# Patient Record
Sex: Male | Born: 1970 | Race: White | Hispanic: No | Marital: Married | Smoking: Never smoker
Health system: Southern US, Community
[De-identification: ages and names within clinical notes are randomized; demographics above are authoritative.]

## PROBLEM LIST (undated history)

## (undated) DIAGNOSIS — K219 Gastro-esophageal reflux disease without esophagitis: Secondary | ICD-10-CM

## (undated) DIAGNOSIS — K222 Esophageal obstruction: Secondary | ICD-10-CM

## (undated) DIAGNOSIS — I10 Essential (primary) hypertension: Secondary | ICD-10-CM

## (undated) DIAGNOSIS — S1121XA Laceration without foreign body of pharynx and cervical esophagus, initial encounter: Secondary | ICD-10-CM

## (undated) HISTORY — DX: Esophageal obstruction: K22.2

## (undated) HISTORY — DX: Gastro-esophageal reflux disease without esophagitis: K21.9

## (undated) HISTORY — DX: Laceration without foreign body of pharynx and cervical esophagus, initial encounter: S11.21XA

## (undated) HISTORY — DX: Essential (primary) hypertension: I10

---

## 2004-09-26 ENCOUNTER — Ambulatory Visit: Payer: Self-pay | Admitting: Family Medicine

## 2006-02-05 ENCOUNTER — Ambulatory Visit (HOSPITAL_COMMUNITY): Admission: RE | Admit: 2006-02-05 | Discharge: 2006-02-05 | Payer: Self-pay | Admitting: Orthopedic Surgery

## 2007-01-25 ENCOUNTER — Other Ambulatory Visit: Payer: Self-pay

## 2007-01-25 ENCOUNTER — Inpatient Hospital Stay: Payer: Self-pay | Admitting: Internal Medicine

## 2007-02-25 ENCOUNTER — Ambulatory Visit: Payer: Self-pay | Admitting: Gastroenterology

## 2007-11-03 IMAGING — CR DG CHEST 2V
1 series · 2 of 2 positions shown · non-contrast
Comparison: none

REASON FOR EXAM: chest pain
COMMENTS:

PROCEDURE:     DXR - DXR CHEST PA (OR AP) AND LATERAL  - January 25, 2007  [DATE]
RESULT:     No acute cardiopulmonary disease identified.

[Series 1: view not recorded · 0.17mm/px · 2 of 2 slices shown]
[im 1/2]
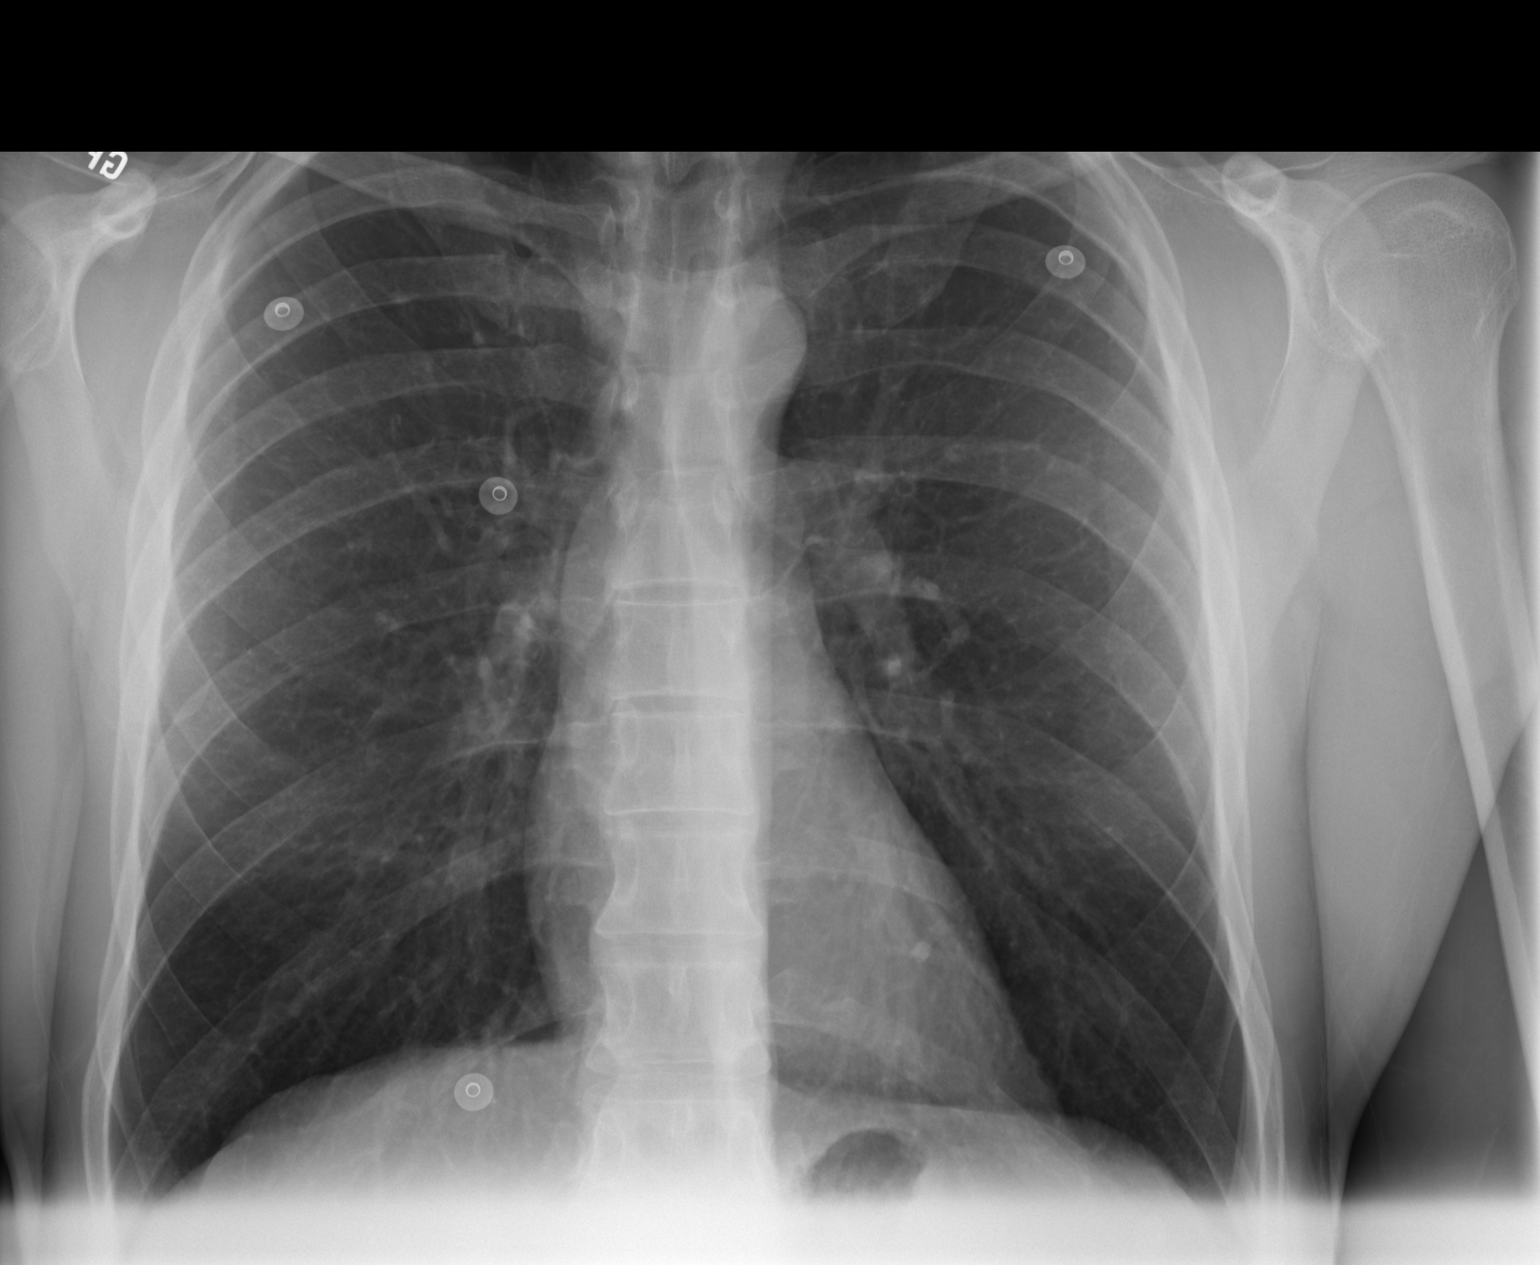
[im 2/2]
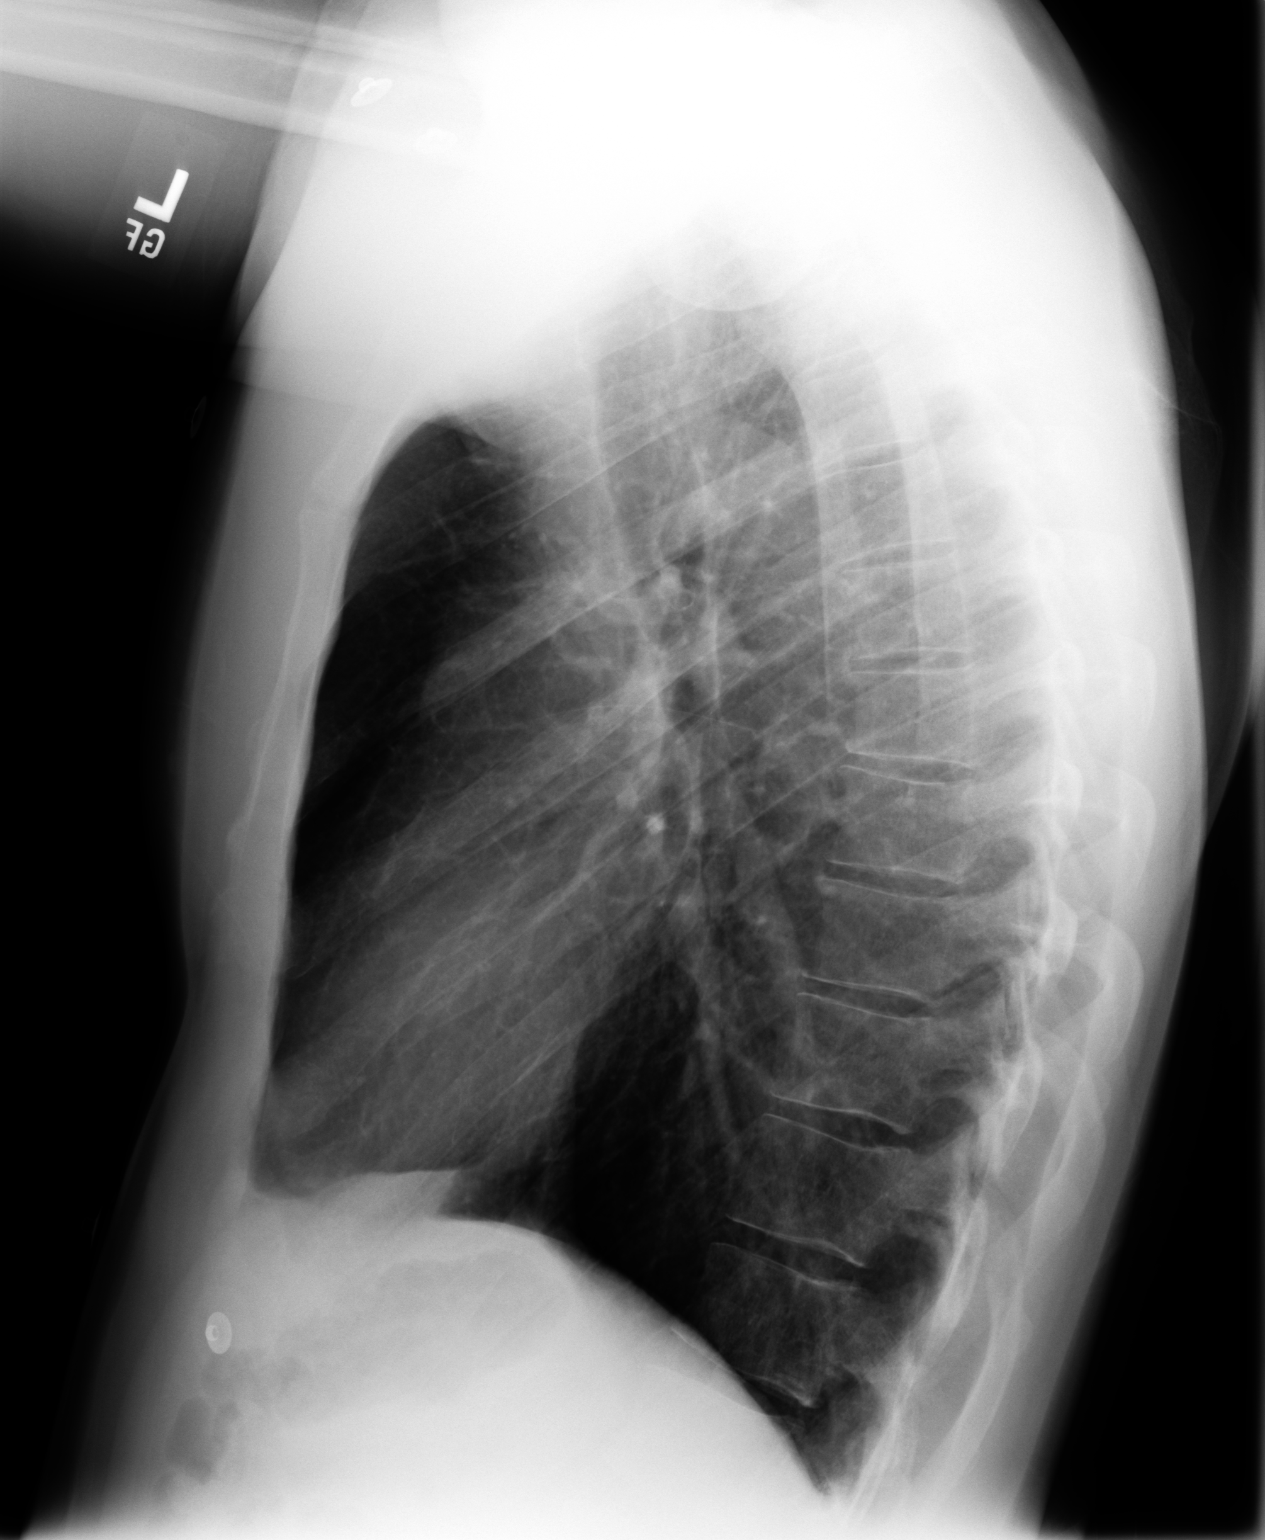

[2 of 2 positions shown; findings below may reference images not displayed]

IMPRESSION: 1)No acute cardiopulmonary disease.

## 2015-03-22 ENCOUNTER — Other Ambulatory Visit: Payer: Self-pay | Admitting: Family Medicine

## 2015-04-07 ENCOUNTER — Ambulatory Visit (INDEPENDENT_AMBULATORY_CARE_PROVIDER_SITE_OTHER): Payer: BLUE CROSS/BLUE SHIELD | Admitting: Family Medicine

## 2015-04-07 ENCOUNTER — Encounter: Payer: Self-pay | Admitting: Family Medicine

## 2015-04-07 VITALS — BP 137/80 | HR 56 | Temp 98.3°F | Ht 73.0 in | Wt 191.5 lb

## 2015-04-07 DIAGNOSIS — I10 Essential (primary) hypertension: Secondary | ICD-10-CM

## 2015-04-07 DIAGNOSIS — Z125 Encounter for screening for malignant neoplasm of prostate: Secondary | ICD-10-CM | POA: Diagnosis not present

## 2015-04-07 DIAGNOSIS — Z13220 Encounter for screening for lipoid disorders: Secondary | ICD-10-CM | POA: Diagnosis not present

## 2015-04-07 LAB — LIPID PANEL PICCOLO, WAIVED
CHOLESTEROL PICCOLO, WAIVED: 211 mg/dL — AB (ref <200)
Chol/HDL Ratio Piccolo,Waive: 3.5 mg/dL
HDL CHOL PICCOLO, WAIVED: 60 mg/dL (ref >59)
LDL Chol Calc Piccolo Waived: 120 mg/dL — ABNORMAL HIGH (ref <100)
Triglycerides Piccolo,Waived: 151 mg/dL — ABNORMAL HIGH (ref <150)
VLDL Chol Calc Piccolo,Waive: 30 mg/dL — ABNORMAL HIGH (ref <30)

## 2015-04-07 LAB — UA/M W/RFLX CULTURE, ROUTINE
BILIRUBIN UA: POSITIVE — AB
Glucose, UA: NEGATIVE
Ketones, UA: NEGATIVE
LEUKOCYTES UA: NEGATIVE
NITRITE UA: NEGATIVE
PROTEIN UA: NEGATIVE
RBC, UA: NEGATIVE
SPEC GRAV UA: 1.02 (ref 1.005–1.030)
UUROB: 0.2 mg/dL (ref 0.2–1.0)
pH, UA: 7 (ref 5.0–7.5)

## 2015-04-07 LAB — MICROALBUMIN, URINE WAIVED
Creatinine, Urine Waived: 200 mg/dL (ref 10–300)
Microalb, Ur Waived: 30 mg/L — ABNORMAL HIGH (ref 0–19)
Microalb/Creat Ratio: <30 mg/g (ref <30)

## 2015-04-07 LAB — CBC WITH DIFFERENTIAL/PLATELET
HEMATOCRIT: 42.6 % (ref 37.5–51.0)
HEMOGLOBIN: 15.3 g/dL (ref 12.6–17.7)
Lymphocytes Absolute: 2.3 10*3/uL (ref 0.7–3.1)
Lymphs: 37 %
MCH: 30.8 pg (ref 26.6–33.0)
MCHC: 35.9 g/dL — AB (ref 31.5–35.7)
MCV: 86 fL (ref 79–97)
MID (Absolute): 0.6 10*3/uL (ref 0.1–1.6)
MID: 10 %
NEUTROS ABS: 3.3 10*3/uL (ref 1.4–7.0)
Neutrophils: 53 %
Platelets: 245 10*3/uL (ref 150–379)
RBC: 4.96 x10E6/uL (ref 4.14–5.80)
RDW: 13.5 % (ref 12.3–15.4)
WBC: 6.2 10*3/uL (ref 3.4–10.8)

## 2015-04-07 MED ORDER — BENAZEPRIL HCL 40 MG PO TABS: 40.0000 mg | ORAL_TABLET | Freq: Every day | ORAL | Status: DC

## 2015-04-07 MED ORDER — MELOXICAM 15 MG PO TABS: 15.0000 mg | ORAL_TABLET | Freq: Every day | ORAL | Status: DC

## 2015-04-07 NOTE — Progress Notes (Signed)
BP 137/80 mmHg  Pulse 56  Temp(Src) 98.3 F (36.8 C)  Ht 6\' 1"  (1.854 m)  Wt 191 lb 8 oz (86.864 kg)  BMI 25.27 kg/m2  SpO2 98%   Subjective:    Patient ID: Brian Gillespie, male    DOB: 02-Dec-1970, 44 y.o.   MRN: [id number]  HPI: Brian HolmesDarin S Gemma is a 44 y.o. male  Chief Complaint  Patient presents with  . Hypertension   HYPERTENSION Hypertension status: controlled  Satisfied with current treatment? yes Duration of hypertension: chronic BP monitoring frequency:  rarely BP medication side effects:  no Medication compliance: excellent compliance Aspirin: no Recurrent headaches: no Visual changes: no Palpitations: no Dyspnea: no Chest pain: no Lower extremity edema: no Dizzy/lightheaded: no   Back pain on Saturday- went to the walk-in and got muscle relaxer and NSAID- doing much better now.   Relevant past medical, surgical, family and social history reviewed and updated as indicated. Interim medical history since our last visit reviewed. Allergies and medications reviewed and updated.  Review of Systems  Constitutional: Negative.   Respiratory: Negative.   Cardiovascular: Negative.   Gastrointestinal: Negative.   Musculoskeletal: Positive for myalgias and back pain. Negative for joint swelling, arthralgias, gait problem, neck pain and neck stiffness.  Neurological: Negative.   Psychiatric/Behavioral: Negative.    Per HPI unless specifically indicated above     Objective:    BP 137/80 mmHg  Pulse 56  Temp(Src) 98.3 F (36.8 C)  Ht 6\' 1"  (1.854 m)  Wt 191 lb 8 oz (86.864 kg)  BMI 25.27 kg/m2  SpO2 98%  Wt Readings from Last 3 Encounters:  04/07/15 191 lb 8 oz (86.864 kg)  09/07/14 187 lb (84.823 kg)    Physical Exam  Constitutional: He is oriented to person, place, and time. He appears well-developed and well-nourished. No distress.  HENT:  Head: Normocephalic and atraumatic.  Right Ear: Hearing normal.  Left Ear: Hearing normal.  Nose: Nose normal.   Eyes: Conjunctivae and lids are normal. Right eye exhibits no discharge. Left eye exhibits no discharge. No scleral icterus.  Cardiovascular: Normal rate, regular rhythm, normal heart sounds and intact distal pulses.  Exam reveals no gallop and no friction rub.   No murmur heard. Pulmonary/Chest: Effort normal and breath sounds normal. No respiratory distress. He has no wheezes. He has no rales. He exhibits no tenderness.  Musculoskeletal: Normal range of motion.  Neurological: He is alert and oriented to person, place, and time.  Skin: Skin is warm, dry and intact. No rash noted. No erythema. No pallor.  Psychiatric: He has a normal mood and affect. His speech is normal and behavior is normal. Judgment and thought content normal. Cognition and memory are normal.  Nursing note and vitals reviewed.   No results found for this or any previous visit.    Assessment & Plan:   Problem List Items Addressed This Visit      Cardiovascular and Mediastinum   HTN (hypertension) - Primary   Relevant Medications   benazepril (LOTENSIN) 40 MG tablet   Other Relevant Orders   CBC With Differential/Platelet   Comprehensive metabolic panel   Microalbumin, Urine Waived   TSH   UA/M w/rflx Culture, Routine    Other Visit Diagnoses    Screening for cholesterol level        Still borderline. Continue to work on diet and exercise. Recheck 6 months.     Relevant Orders    Lipid Panel [place]  Screening for prostate cancer        Relevant Orders    PSA    UA/M w/rflx Culture, Routine        Follow up plan: Return in about 6 months (around 10/08/2015) for PE.

## 2015-04-07 NOTE — Assessment & Plan Note (Signed)
Well controlled on current regimen. Continue current regimen. Continue to monitor.  

## 2015-04-07 NOTE — Patient Instructions (Signed)
Cholesterol  Cholesterol is a fat. Your body needs a small amount of cholesterol. Cholesterol may build up in your blood vessels. This increases your chance of having a heart attack or stroke.  You cannot feel your cholesterol levels. The only way to know your cholesterol level is high is with a blood test. Keep your test results. Work with your doctor to keep your cholesterol at a good level.  WHAT DO THE TEST RESULTS MEAN?  · Total cholesterol is how much cholesterol is in your blood.  · LDL is bad cholesterol. This is the type that can build up. You want LDL to be low.  · HDL is good cholesterol. It cleans your blood vessels and carries LDL away. You want HDL to be high.  · Triglycerides are fat that the body can burn for energy or store.  WHAT ARE GOOD LEVELS OF CHOLESTEROL?  · Total cholesterol below 200.  · LDL below 100 for people at risk. Below 70 for those at very high risk.  · HDL above 50 is good. Above 60 is best.  · Triglycerides below 150.  HOW CAN I LOWER MY CHOLESTEROL?  · Diet. Follow your diet programs as told by your doctor.  ¨ Choose fish, white meat chicken, roasted turkey, or baked turkey. Try not to eat red meat, fried foods, or processed meats such as sausage and lunch meats.  ¨ Eat lots of fresh fruits and vegetables.  ¨ Choose whole grains, beans, pasta, potatoes, and cereals.  ¨ Use only small amounts of olive, corn, or canola oils.  ¨ Try not to eat butter, mayonnaise, shortening, or palm kernel oils.  ¨ Try not to eat foods with trans fats.  ¨ Drink skim or nonfat milk. Eat low-fat or nonfat yogurt and cheeses. Try not to drink whole milk or cream. Try not to eat ice cream, egg yolks, and full-fat cheeses.  ¨ Healthy desserts include angel food cake, ginger snaps, animal crackers, hard candy, popsicles, and low-fat or nonfat frozen yogurt. Try not to eat pastries, cakes, pies, and cookies.  · Exercise. Follow your exercise programs as told by your doctor.  ¨ Be more active. You can try  gardening, walking, or taking the stairs. Ask your doctor about how you can be more active.  · Medicine. Take medicine as told by your doctor.  Document Released: 12/07/2008 Document Revised: 01/25/2014 Document Reviewed: 06/24/2013  [employer]® Patient Information ©2015 [employer]. This information is not intended to replace advice given to you by your health care provider. Make sure you discuss any questions you have with your health care provider.

## 2015-04-08 ENCOUNTER — Encounter: Payer: Self-pay | Admitting: Family Medicine

## 2015-04-08 LAB — COMPREHENSIVE METABOLIC PANEL
A/G RATIO: 1.9 (ref 1.1–2.5)
ALBUMIN: 4.4 g/dL (ref 3.5–5.5)
ALT: 39 IU/L (ref 0–44)
AST: 26 IU/L (ref 0–40)
Alkaline Phosphatase: 48 IU/L (ref 39–117)
BILIRUBIN TOTAL: 0.6 mg/dL (ref 0.0–1.2)
BUN / CREAT RATIO: 13 (ref 9–20)
BUN: 14 mg/dL (ref 6–24)
CALCIUM: 9.3 mg/dL (ref 8.7–10.2)
CO2: 23 mmol/L (ref 18–29)
CREATININE: 1.05 mg/dL (ref 0.76–1.27)
Chloride: 103 mmol/L (ref 97–108)
GFR, EST AFRICAN AMERICAN: 99 mL/min/{1.73_m2} (ref >59)
GFR, EST NON AFRICAN AMERICAN: 85 mL/min/{1.73_m2} (ref >59)
GLOBULIN, TOTAL: 2.3 g/dL (ref 1.5–4.5)
Glucose: 89 mg/dL (ref 65–99)
Potassium: 4.3 mmol/L (ref 3.5–5.2)
SODIUM: 142 mmol/L (ref 134–144)
TOTAL PROTEIN: 6.7 g/dL (ref 6.0–8.5)

## 2015-04-08 LAB — TSH: TSH: 0.749 u[IU]/mL (ref 0.450–4.500)

## 2015-04-08 LAB — PSA: PROSTATE SPECIFIC AG, SERUM: 1.3 ng/mL (ref 0.0–4.0)

## 2015-11-01 ENCOUNTER — Other Ambulatory Visit: Payer: Self-pay | Admitting: Family Medicine

## 2015-11-01 NOTE — Telephone Encounter (Signed)
Patient is scheduled for 11/08/15 @ 9:00am, can you please call enough medication to get him through until his appointment

## 2015-11-01 NOTE — Telephone Encounter (Signed)
Needs an appointment.

## 2015-11-08 ENCOUNTER — Encounter: Payer: Self-pay | Admitting: Family Medicine

## 2015-11-08 ENCOUNTER — Ambulatory Visit (INDEPENDENT_AMBULATORY_CARE_PROVIDER_SITE_OTHER): Payer: BLUE CROSS/BLUE SHIELD | Admitting: Family Medicine

## 2015-11-08 VITALS — BP 133/81 | HR 49 | Temp 97.5°F | Ht 72.1 in | Wt 190.0 lb

## 2015-11-08 DIAGNOSIS — I10 Essential (primary) hypertension: Secondary | ICD-10-CM

## 2015-11-08 DIAGNOSIS — M545 Low back pain, unspecified: Secondary | ICD-10-CM

## 2015-11-08 MED ORDER — MELOXICAM 15 MG PO TABS: ORAL_TABLET | ORAL | Status: DC

## 2015-11-08 MED ORDER — BENAZEPRIL HCL 40 MG PO TABS: 40.0000 mg | ORAL_TABLET | Freq: Every day | ORAL | Status: DC

## 2015-11-08 NOTE — Progress Notes (Signed)
BP 133/81 mmHg  Pulse 49  Temp(Src) 97.5 F (36.4 C)  Ht 6' 0.1" (1.831 m)  Wt 190 lb (86.183 kg)  BMI 25.71 kg/m2  SpO2 100%   Subjective:    Patient ID: Brian Gillespie, male    DOB: 11/08/1970, 45 y.o.   MRN: [id number]  HPI: Brian Gillespie is a 45 y.o. male  Chief Complaint  Patient presents with  . Hypertension    Refills needed  . Back Pain    Refills needed   HYPERTENSION Hypertension status: controlled  Satisfied with current treatment? yes Duration of hypertension: chronic BP monitoring frequency:  not checking BP medication side effects:  no Medication compliance: excellent compliance Aspirin: no Recurrent headaches: no Visual changes: no Palpitations: no Dyspnea: no Chest pain: no Lower extremity edema: no Dizzy/lightheaded: no  BACK PAIN Duration: chronic Mechanism of injury: lifting Location: bilateral and low back Onset: gradual Severity: moderate Quality: dull Frequency: intermittent Radiation: none Aggravating factors: movement, bending and prolonged sitting Alleviating factors: NSAIDs Status: worse Treatments attempted: aleve and HEP  Relief with NSAIDs?: significant Nighttime pain:  no Paresthesias / decreased sensation:  no Bowel / bladder incontinence:  no Fevers:  no Dysuria / urinary frequency:  no   Relevant past medical, surgical, family and social history reviewed and updated as indicated. Interim medical history since our last visit reviewed. Allergies and medications reviewed and updated.  Review of Systems  Constitutional: Negative.   Respiratory: Negative.   Cardiovascular: Negative.   Musculoskeletal: Positive for myalgias and back pain. Negative for joint swelling, arthralgias, gait problem, neck pain and neck stiffness.  Psychiatric/Behavioral: Negative.     Per HPI unless specifically indicated above     Objective:    BP 133/81 mmHg  Pulse 49  Temp(Src) 97.5 F (36.4 C)  Ht 6' 0.1" (1.831 m)  Wt 190 lb  (86.183 kg)  BMI 25.71 kg/m2  SpO2 100%  Wt Readings from Last 3 Encounters:  11/08/15 190 lb (86.183 kg)  04/07/15 191 lb 8 oz (86.864 kg)  09/07/14 187 lb (84.823 kg)    Physical Exam  Constitutional: He is oriented to person, place, and time. He appears well-developed and well-nourished. No distress.  HENT:  Head: Normocephalic and atraumatic.  Right Ear: Hearing normal.  Left Ear: Hearing normal.  Nose: Nose normal.  Eyes: Conjunctivae and lids are normal. Right eye exhibits no discharge. Left eye exhibits no discharge. No scleral icterus.  Cardiovascular: Normal rate, regular rhythm, normal heart sounds and intact distal pulses.  Exam reveals no gallop and no friction rub.   No murmur heard. Pulmonary/Chest: Effort normal and breath sounds normal. No respiratory distress. He has no wheezes. He has no rales. He exhibits no tenderness.  Musculoskeletal: Normal range of motion.  Neurological: He is alert and oriented to person, place, and time.  Skin: Skin is warm, dry and intact. No rash noted. No erythema. No pallor.  Psychiatric: He has a normal mood and affect. His speech is normal and behavior is normal. Judgment and thought content normal. Cognition and memory are normal.  Nursing note and vitals reviewed. Back Exam:    Inspection:  Normal spinal curvature.  No deformity, ecchymosis, erythema, or lesions     Palpation:     Midline spinal tenderness: no       Paralumbar tenderness: yes bilateral     Parathoracic tenderness: no      Buttocks tenderness: no     Range of Motion:  Flexion: Normal     Extension:Decreased     Lateral bending:Normal    Rotation:Normal    Neuro Exam:Lower extremity DTRs normal & symmetric.  Strength and sensation intact.    Special Tests:      Straight leg raise:negative   Results for orders placed or performed in visit on 04/07/15  Comprehensive metabolic panel  Result Value Ref Range   Glucose 89 65 - 99 mg/dL   BUN 14 6 - 24 mg/dL    Creatinine, Ser 1.61 0.76 - 1.27 mg/dL   GFR calc non Af Amer 85 >59 mL/min/1.73   GFR calc Af Amer 99 >59 mL/min/1.73   BUN/Creatinine Ratio 13 9 - 20   Sodium 142 134 - 144 mmol/L   Potassium 4.3 3.5 - 5.2 mmol/L   Chloride 103 97 - 108 mmol/L   CO2 23 18 - 29 mmol/L   Calcium 9.3 8.7 - 10.2 mg/dL   Total Protein 6.7 6.0 - 8.5 g/dL   Albumin 4.4 3.5 - 5.5 g/dL   Globulin, Total 2.3 1.5 - 4.5 g/dL   Albumin/Globulin Ratio 1.9 1.1 - 2.5   Bilirubin Total 0.6 0.0 - 1.2 mg/dL   Alkaline Phosphatase 48 39 - 117 IU/L   AST 26 0 - 40 IU/L   ALT 39 0 - 44 IU/L  Lipid Panel Piccolo, Waived  Result Value Ref Range   Cholesterol Piccolo, Waived 211 (H) <200 mg/dL   HDL Chol Piccolo, Waived 60 >59 mg/dL   Triglycerides Piccolo,Waived 151 (H) <150 mg/dL   Chol/HDL Ratio Piccolo,Waive 3.5 mg/dL   LDL Chol Calc Piccolo Waived 120 (H) <100 mg/dL   VLDL Chol Calc Piccolo,Waive 30 (H) <30 mg/dL  Microalbumin, Urine Waived  Result Value Ref Range   Microalb, Ur Waived 30 (H) 0 - 19 mg/L   Creatinine, Urine Waived 200 10 - 300 mg/dL   Microalb/Creat Ratio <30 <30 mg/g  TSH  Result Value Ref Range   TSH 0.749 0.450 - 4.500 uIU/mL  PSA  Result Value Ref Range   Prostate Specific Ag, Serum 1.3 0.0 - 4.0 ng/mL  UA/M w/rflx Culture, Routine  Result Value Ref Range   Specific Gravity, UA 1.020 1.005 - 1.030   pH, UA 7.0 5.0 - 7.5   Color, UA Yellow Yellow   Appearance Ur Clear Clear   Leukocytes, UA Negative Negative   Protein, UA Negative Negative/Trace   Glucose, UA Negative Negative   Ketones, UA Negative Negative   RBC, UA Negative Negative   Bilirubin, UA Positive (A) Negative   Urobilinogen, Ur 0.2 0.2 - 1.0 mg/dL   Nitrite, UA Negative Negative  CBC With Differential/Platelet  Result Value Ref Range   WBC 6.2 3.4 - 10.8 x10E3/uL   RBC 4.96 4.14 - 5.80 x10E6/uL   Hemoglobin 15.3 12.6 - 17.7 g/dL   Hematocrit 09.6 04.5 - 51.0 %   MCV 86 79 - 97 fL   MCH 30.8 26.6 - 33.0 pg    MCHC 35.9 (H) 31.5 - 35.7 g/dL   RDW 40.9 81.1 - 91.4 %   Platelets 245 150 - 379 x10E3/uL   Neutrophils 53 %   Lymphs 37 %   MID 10 %   Neutrophils Absolute 3.3 1.4 - 7.0 x10E3/uL   Lymphocytes Absolute 2.3 0.7 - 3.1 x10E3/uL   MID (Absolute) 0.6 0.1 - 1.6 X10E3/uL      Assessment & Plan:   Problem List Items Addressed This Visit      Cardiovascular and  Mediastinum   HTN (hypertension) - Primary    Under good control. Continue current regimen. Refills given. Return for recheck in 6 months at physical.       Relevant Medications   benazepril (LOTENSIN) 40 MG tablet   Other Relevant Orders   Basic metabolic panel    Other Visit Diagnoses    Bilateral low back pain without sciatica        Under good control on current regimen. Continue exercises. Refills given.     Relevant Medications    meloxicam (MOBIC) 15 MG tablet        Follow up plan: Return in about 6 months (around 05/07/2016) for For physical.

## 2015-11-08 NOTE — Assessment & Plan Note (Signed)
Under good control. Continue current regimen. Refills given. Return for recheck in 6 months at physical.

## 2015-11-09 ENCOUNTER — Encounter: Payer: Self-pay | Admitting: Family Medicine

## 2015-11-09 LAB — BASIC METABOLIC PANEL
BUN/Creatinine Ratio: 19 (ref 9–20)
BUN: 19 mg/dL (ref 6–24)
CALCIUM: 9.6 mg/dL (ref 8.7–10.2)
CO2: 26 mmol/L (ref 18–29)
Chloride: 102 mmol/L (ref 96–106)
Creatinine, Ser: 1.01 mg/dL (ref 0.76–1.27)
GFR calc Af Amer: 103 mL/min/{1.73_m2} (ref >59)
GFR calc non Af Amer: 89 mL/min/{1.73_m2} (ref >59)
GLUCOSE: 88 mg/dL (ref 65–99)
POTASSIUM: 4.2 mmol/L (ref 3.5–5.2)
Sodium: 140 mmol/L (ref 134–144)

## 2016-03-17 DIAGNOSIS — M545 Low back pain: Secondary | ICD-10-CM | POA: Diagnosis not present

## 2016-03-17 DIAGNOSIS — Z791 Long term (current) use of non-steroidal anti-inflammatories (NSAID): Secondary | ICD-10-CM | POA: Diagnosis not present

## 2016-03-29 DIAGNOSIS — M545 Low back pain: Secondary | ICD-10-CM | POA: Diagnosis not present

## 2016-04-02 ENCOUNTER — Telehealth: Payer: Self-pay

## 2016-04-02 NOTE — Telephone Encounter (Signed)
Patient requested lab results, printed for patient to pick up.

## 2016-04-03 DIAGNOSIS — M545 Low back pain: Secondary | ICD-10-CM | POA: Diagnosis not present

## 2016-06-19 ENCOUNTER — Other Ambulatory Visit: Payer: Self-pay | Admitting: Family Medicine

## 2016-06-26 ENCOUNTER — Other Ambulatory Visit: Payer: Self-pay | Admitting: Family Medicine

## 2016-06-28 DIAGNOSIS — Z23 Encounter for immunization: Secondary | ICD-10-CM | POA: Diagnosis not present

## 2016-09-29 ENCOUNTER — Other Ambulatory Visit: Payer: Self-pay | Admitting: Family Medicine

## 2016-10-18 ENCOUNTER — Ambulatory Visit (INDEPENDENT_AMBULATORY_CARE_PROVIDER_SITE_OTHER): Payer: BLUE CROSS/BLUE SHIELD | Admitting: Family Medicine

## 2016-10-18 ENCOUNTER — Encounter: Payer: Self-pay | Admitting: Family Medicine

## 2016-10-18 VITALS — BP 159/81 | HR 61 | Temp 98.6°F | Wt 191.0 lb

## 2016-10-18 DIAGNOSIS — R6889 Other general symptoms and signs: Secondary | ICD-10-CM | POA: Diagnosis not present

## 2016-10-18 DIAGNOSIS — J101 Influenza due to other identified influenza virus with other respiratory manifestations: Secondary | ICD-10-CM | POA: Diagnosis not present

## 2016-10-18 LAB — VERITOR FLU A/B WAIVED
Influenza A: POSITIVE — AB
Influenza B: NEGATIVE

## 2016-10-18 MED ORDER — OSELTAMIVIR PHOSPHATE 75 MG PO CAPS: 75.0000 mg | ORAL_CAPSULE | Freq: Two times a day (BID) | ORAL | 0 refills | Status: DC

## 2016-10-18 NOTE — Progress Notes (Signed)
BP (!) 159/81   Pulse 61   Temp 98.6 F (37 C)   Wt 191 lb (86.6 kg)   SpO2 100%   BMI 25.83 kg/m    Subjective:    Patient ID: Brian Gillespie, male    DOB: 25-Jul-1971, 46 y.o.   MRN: [id number]  HPI: [name] is a 46 y.o. male  Chief Complaint  Patient presents with  . Sinusitis    x 2 days. Possible fever. body aches, head/chest pressure, sinus drainage, raw throat. No cough. No ear ache.   Patient presents with 2 day history of fever, body aches, chills, congestion, sinus pressure, and sore throat. Denies cough, ear pain, CP, SOB, wheezing. Taking ibuprofen prn for sxs with some relief. No sick contacts reported.   Past Medical History:  Diagnosis Date  . Esophageal stenosis   . Esophageal tear   . Hypertension    Social History   Social History  . Marital status: Married    Spouse name: N/A  . Number of children: N/A  . Years of education: N/A   Occupational History  . Not on file.   Social History Main Topics  . Smoking status: Never Smoker  . Smokeless tobacco: Former Neurosurgeon    Quit date: 09/24/2004  . Alcohol use No  . Drug use: No  . Sexual activity: Not Currently   Other Topics Concern  . Not on file   Social History Narrative  . No narrative on file    Relevant past medical, surgical, family and social history reviewed and updated as indicated. Interim medical history since our last visit reviewed. Allergies and medications reviewed and updated.  Review of Systems  Constitutional: Positive for chills, diaphoresis and fever.  HENT: Positive for congestion, sinus pressure and sore throat.   Respiratory: Negative.   Cardiovascular: Negative.   Gastrointestinal: Negative.   Genitourinary: Negative.   Musculoskeletal: Positive for myalgias.  Neurological: Negative.   Psychiatric/Behavioral: Negative.     Per HPI unless specifically indicated above     Objective:    BP (!) 159/81   Pulse 61   Temp 98.6 F (37 C)   Wt 191 lb (86.6  kg)   SpO2 100%   BMI 25.83 kg/m   Wt Readings from Last 3 Encounters:  10/18/16 191 lb (86.6 kg)  11/08/15 190 lb (86.2 kg)  04/07/15 191 lb 8 oz (86.9 kg)    Physical Exam  Constitutional: He is oriented to person, place, and time. He appears well-developed and well-nourished.  HENT:  Head: Atraumatic.  Right Ear: External ear normal.  Left Ear: External ear normal.  Nose: Nose normal.  Mouth/Throat: No oropharyngeal exudate.  Oropharynx erythematous Nasal mucosa injected  Eyes: Conjunctivae are normal. Pupils are equal, round, and reactive to light. No scleral icterus.  Neck: Normal range of motion. Neck supple.  Cardiovascular: Normal rate.   Pulmonary/Chest: Effort normal and breath sounds normal. No respiratory distress.  Musculoskeletal: Normal range of motion.  Neurological: He is alert and oriented to person, place, and time.  Skin: Skin is warm and dry.  Psychiatric: He has a normal mood and affect. His behavior is normal.  Nursing note and vitals reviewed.     Assessment & Plan:   Problem List Items Addressed This Visit    None    Visit Diagnoses    Influenza A    -  Primary   Tamiflu sent, supportive care discussed. Follow up if worsening or no improvement.  Relevant Medications   oseltamivir (TAMIFLU) 75 MG capsule   Other Relevant Orders   Influenza A & B (STAT) (Completed)       Follow up plan: Return if symptoms worsen or fail to improve.

## 2016-10-18 NOTE — Patient Instructions (Signed)
Follow up as needed

## 2016-10-23 ENCOUNTER — Other Ambulatory Visit: Payer: Self-pay | Admitting: Family Medicine

## 2016-11-04 ENCOUNTER — Other Ambulatory Visit: Payer: Self-pay | Admitting: Family Medicine

## 2016-11-05 NOTE — Telephone Encounter (Signed)
Last (acute) OV: 10/18/16 Last routine OV: 11/08/15 Next OV: none on file.

## 2016-11-24 ENCOUNTER — Other Ambulatory Visit: Payer: Self-pay | Admitting: Family Medicine

## 2016-12-29 ENCOUNTER — Other Ambulatory Visit: Payer: Self-pay | Admitting: Unknown Physician Specialty

## 2016-12-31 NOTE — Telephone Encounter (Signed)
Your patient.  Thanks 

## 2017-01-27 ENCOUNTER — Other Ambulatory Visit: Payer: Self-pay | Admitting: Family Medicine

## 2017-02-25 ENCOUNTER — Other Ambulatory Visit: Payer: Self-pay | Admitting: Family Medicine

## 2017-03-27 ENCOUNTER — Other Ambulatory Visit: Payer: Self-pay | Admitting: Unknown Physician Specialty

## 2017-05-07 ENCOUNTER — Other Ambulatory Visit: Payer: Self-pay | Admitting: Family Medicine

## 2017-05-21 ENCOUNTER — Ambulatory Visit (INDEPENDENT_AMBULATORY_CARE_PROVIDER_SITE_OTHER): Payer: BLUE CROSS/BLUE SHIELD | Admitting: Family Medicine

## 2017-05-21 ENCOUNTER — Encounter: Payer: Self-pay | Admitting: Family Medicine

## 2017-05-21 VITALS — BP 128/70 | HR 56 | Temp 98.5°F | Ht 72.3 in | Wt 184.3 lb

## 2017-05-21 DIAGNOSIS — K219 Gastro-esophageal reflux disease without esophagitis: Secondary | ICD-10-CM | POA: Diagnosis not present

## 2017-05-21 DIAGNOSIS — R1310 Dysphagia, unspecified: Secondary | ICD-10-CM

## 2017-05-21 DIAGNOSIS — Z0000 Encounter for general adult medical examination without abnormal findings: Secondary | ICD-10-CM

## 2017-05-21 DIAGNOSIS — R1319 Other dysphagia: Secondary | ICD-10-CM

## 2017-05-21 DIAGNOSIS — I10 Essential (primary) hypertension: Secondary | ICD-10-CM | POA: Diagnosis not present

## 2017-05-21 LAB — UA/M W/RFLX CULTURE, ROUTINE
BILIRUBIN UA: NEGATIVE
Glucose, UA: NEGATIVE
KETONES UA: NEGATIVE
LEUKOCYTES UA: NEGATIVE
NITRITE UA: NEGATIVE
Protein, UA: NEGATIVE
RBC UA: NEGATIVE
SPEC GRAV UA: 1.015 (ref 1.005–1.030)
UUROB: 0.2 mg/dL (ref 0.2–1.0)
pH, UA: 6 (ref 5.0–7.5)

## 2017-05-21 LAB — MICROALBUMIN, URINE WAIVED
Creatinine, Urine Waived: 200 mg/dL (ref 10–300)
MICROALB, UR WAIVED: 30 mg/L — AB (ref 0–19)
Microalb/Creat Ratio: <30 mg/g (ref <30)

## 2017-05-21 LAB — MICROSCOPIC EXAMINATION: WBC UA: NONE SEEN /HPF (ref 0–?)

## 2017-05-21 MED ORDER — OMEPRAZOLE 40 MG PO CPDR: 40.0000 mg | DELAYED_RELEASE_CAPSULE | Freq: Every day | ORAL | 3 refills | Status: DC

## 2017-05-21 NOTE — Assessment & Plan Note (Addendum)
Will start on 40mg  of omeprazole and recheck in 1 month. Will get into see GI- referral generated today.

## 2017-05-21 NOTE — Assessment & Plan Note (Signed)
Under good control on recheck. Continue current regimen. Continue to monitor. Call with any concerns.  

## 2017-05-21 NOTE — Progress Notes (Signed)
BP 128/70   Pulse (!) 56   Temp 98.5 F (36.9 C)   Ht 6' 0.3" (1.836 m)   Wt 184 lb 5 oz (83.6 kg)   SpO2 100%   BMI 24.79 kg/m    Subjective:    Patient ID: Brian Gillespie, male    DOB: 1971-07-02, 46 y.o.   MRN: [id number]  HPI: Brian Gillespie is a 46 y.o. male presenting on 05/21/2017 for comprehensive medical examination. Current medical complaints include:  HYPERTENSION Hypertension status: stable  Satisfied with current treatment? yes Duration of hypertension: chronic BP monitoring frequency:  not checking BP medication side effects:  no Medication compliance: good compliance Previous BP meds: benazepril Aspirin: no Recurrent headaches: no Visual changes: no Palpitations: no Dyspnea: no Chest pain: no Lower extremity edema: no Dizzy/lightheaded: no  GERD- dentist said that the acid is starting to wear away at his enamal GERD control status: uncontrolled  Satisfied with current treatment? no Heartburn frequency: several times a week Previous GERD medications: Nexium for about a month Antacid use frequency:  Several times a week Duration: years Nature: indigestion Location: in his stomach with burping Dysphagia: yes- tore his esophagus years ago, had dilation Odynophagia:  no Hematemesis: no Blood in stool: no EGD: yes- between 5-10 years ago  He currently lives with: wife Interim Problems from his last visit: yes  Depression Screen done today and results listed below:  Depression screen [facility] 2/9 05/21/2017  Decreased Interest 0  Down, Depressed, Hopeless 0  PHQ - 2 Score 0  Altered sleeping 1  Tired, decreased energy 0  Change in appetite 0  Feeling bad or failure about yourself  0  Trouble concentrating 0  Moving slowly or fidgety/restless 0  Suicidal thoughts 0  PHQ-9 Score 1  Difficult doing work/chores Not difficult at all   Past Medical History:  Past Medical History:  Diagnosis Date  . Esophageal stenosis   . Esophageal tear   .  Hypertension     Surgical History:  History reviewed. No pertinent surgical history.  Medications:  Current Outpatient Prescriptions on File Prior to Visit  Medication Sig  . benazepril (LOTENSIN) 40 MG tablet TAKE 1 TABLET(40 MG) BY MOUTH DAILY  . meloxicam (MOBIC) 15 MG tablet TAKE 1 TABLET(15 MG) BY MOUTH DAILY   No current facility-administered medications on file prior to visit.     Allergies:  Allergies  Allergen Reactions  . Penicillins Rash    Social History:  Social History   Social History  . Marital status: Married    Spouse name: N/A  . Number of children: N/A  . Years of education: N/A   Occupational History  . Not on file.   Social History Main Topics  . Smoking status: Never Smoker  . Smokeless tobacco: Former Neurosurgeon    Quit date: 09/24/2004  . Alcohol use No  . Drug use: No  . Sexual activity: Yes   Other Topics Concern  . Not on file   Social History Narrative  . No narrative on file   History  Smoking Status  . Never Smoker  Smokeless Tobacco  . Former Neurosurgeon  . Quit date: 09/24/2004   History  Alcohol Use No    Family History:  Family History  Problem Relation Age of Onset  . Cancer Paternal Grandmother   . Cancer Paternal Grandfather     Past medical history, surgical history, medications, allergies, family history and social history reviewed with patient today and changes  made to appropriate areas of the chart.   Review of Systems  Constitutional: Negative.   HENT: Negative.   Eyes: Positive for blurred vision. Negative for double vision, photophobia, pain, discharge and redness.  Respiratory: Negative.   Cardiovascular: Negative.   Gastrointestinal: Positive for heartburn. Negative for abdominal pain, blood in stool, constipation, diarrhea, melena, nausea and vomiting.  Genitourinary: Negative.   Musculoskeletal: Negative.   Skin: Negative.   Neurological: Negative.   Endo/Heme/Allergies: Negative.   Psychiatric/Behavioral:  Negative.     All other ROS negative except what is listed above and in the HPI.      Objective:    BP 128/70   Pulse (!) 56   Temp 98.5 F (36.9 C)   Ht 6' 0.3" (1.836 m)   Wt 184 lb 5 oz (83.6 kg)   SpO2 100%   BMI 24.79 kg/m   Wt Readings from Last 3 Encounters:  05/21/17 184 lb 5 oz (83.6 kg)  10/18/16 191 lb (86.6 kg)  11/08/15 190 lb (86.2 kg)    Physical Exam  Constitutional: He is oriented to person, place, and time. He appears well-developed and well-nourished. No distress.  HENT:  Head: Normocephalic and atraumatic.  Right Ear: Hearing, tympanic membrane, external ear and ear canal normal.  Left Ear: Hearing, tympanic membrane, external ear and ear canal normal.  Nose: Nose normal.  Mouth/Throat: Uvula is midline, oropharynx is clear and moist and mucous membranes are normal. No oropharyngeal exudate.  Eyes: Pupils are equal, round, and reactive to light. Conjunctivae, EOM and lids are normal. Right eye exhibits no discharge. Left eye exhibits no discharge. No scleral icterus.  Neck: Normal range of motion. Neck supple. No JVD present. No tracheal deviation present. No thyromegaly present.  Cardiovascular: Normal rate, regular rhythm, normal heart sounds and intact distal pulses.  Exam reveals no gallop and no friction rub.   No murmur heard. Pulmonary/Chest: Effort normal and breath sounds normal. No stridor. No respiratory distress. He has no wheezes. He has no rales. He exhibits no tenderness.  Abdominal: Soft. Bowel sounds are normal. He exhibits no distension and no mass. There is no tenderness. There is no rebound and no guarding. Hernia confirmed negative in the right inguinal area and confirmed negative in the left inguinal area.  Genitourinary: Testes normal and penis normal. Right testis shows no mass, no swelling and no tenderness. Right testis is descended. Cremasteric reflex is not absent on the right side. Left testis shows no mass, no swelling and no  tenderness. Left testis is descended. Cremasteric reflex is not absent on the left side. Circumcised. No phimosis, paraphimosis, hypospadias, penile erythema or penile tenderness. No discharge found.  Musculoskeletal: Normal range of motion. He exhibits no edema, tenderness or deformity.  Lymphadenopathy:    He has no cervical adenopathy.  Neurological: He is alert and oriented to person, place, and time. He has normal reflexes. He displays normal reflexes. No cranial nerve deficit. He exhibits normal muscle tone. Coordination normal.  Skin: Skin is warm, dry and intact. No rash noted. He is not diaphoretic. No erythema. No pallor.  Psychiatric: He has a normal mood and affect. His speech is normal and behavior is normal. Judgment and thought content normal. Cognition and memory are normal.  Nursing note and vitals reviewed.   Results for orders placed or performed in visit on 10/18/16  Influenza A & B (STAT)  Result Value Ref Range   Influenza A Positive (A) Negative   Influenza B Negative  Negative      Assessment & Plan:   Problem List Items Addressed This Visit      Cardiovascular and Mediastinum   HTN (hypertension)    Under good control on recheck. Continue current regimen. Continue to monitor. Call with any concerns.       Relevant Orders   Microalbumin, Urine Waived     Digestive   Dysphagia    Will start on 40mg  of omeprazole and recheck in 1 month. Will get into see GI- referral generated today.      Relevant Orders   Ambulatory referral to Gastroenterology    Other Visit Diagnoses    Routine general medical examination at a health care facility    -  Primary   Vaccines up to date. Screening labs checked today. Continue diet and exercise. Call with any concerns.    Relevant Orders   CBC with Differential/Platelet   Comprehensive metabolic panel   Lipid Panel w/o Chol/HDL Ratio   Microalbumin, Urine Waived   TSH   UA/M w/rflx Culture, Routine   Gastroesophageal  reflux disease, esophagitis presence not specified       Will start omeprazole 40mg  and recheck in 1 month.    Relevant Medications   omeprazole (PRILOSEC) 40 MG capsule   Other Relevant Orders   Ambulatory referral to Gastroenterology   H. pylori antibody, IgG       LABORATORY TESTING:  Health maintenance labs ordered today as discussed above.   IMMUNIZATIONS:   - Tdap: Tetanus vaccination status reviewed: last tetanus booster within 10 years. - Influenza: Postponed to flu season - Pneumovax: Not applicable  PATIENT COUNSELING:    Sexuality: Discussed sexually transmitted diseases, partner selection, use of condoms, avoidance of unintended pregnancy  and contraceptive alternatives.   Advised to avoid cigarette smoking.  I discussed with the patient that most people either abstain from alcohol or drink within safe limits (<=14/week and <=4 drinks/occasion for males, <=7/weeks and <= 3 drinks/occasion for females) and that the risk for alcohol disorders and other health effects rises proportionally with the number of drinks per week and how often a drinker exceeds daily limits.  Discussed cessation/primary prevention of drug use and availability of treatment for abuse.   Diet: Encouraged to adjust caloric intake to maintain  or achieve ideal body weight, to reduce intake of dietary saturated fat and total fat, to limit sodium intake by avoiding high sodium foods and not adding table salt, and to maintain adequate dietary potassium and calcium preferably from fresh fruits, vegetables, and low-fat dairy products.    stressed the importance of regular exercise  Injury prevention: Discussed safety belts, safety helmets, smoke detector, smoking near bedding or upholstery.   Dental health: Discussed importance of regular tooth brushing, flossing, and dental visits.   Follow up plan: NEXT PREVENTATIVE PHYSICAL DUE IN 1 YEAR. Return in about 4 weeks (around 06/18/2017) for Follow up  GERD.

## 2017-05-21 NOTE — Patient Instructions (Addendum)

## 2017-05-22 ENCOUNTER — Encounter: Payer: Self-pay | Admitting: Family Medicine

## 2017-05-22 LAB — CBC WITH DIFFERENTIAL/PLATELET
BASOS: 1 %
Basophils Absolute: 0 10*3/uL (ref 0.0–0.2)
EOS (ABSOLUTE): 0.2 10*3/uL (ref 0.0–0.4)
EOS: 2 %
HEMATOCRIT: 45.7 % (ref 37.5–51.0)
Hemoglobin: 15.9 g/dL (ref 13.0–17.7)
IMMATURE GRANULOCYTES: 0 %
Immature Grans (Abs): 0 10*3/uL (ref 0.0–0.1)
Lymphocytes Absolute: 2 10*3/uL (ref 0.7–3.1)
Lymphs: 30 %
MCH: 30.8 pg (ref 26.6–33.0)
MCHC: 34.8 g/dL (ref 31.5–35.7)
MCV: 88 fL (ref 79–97)
MONOS ABS: 0.5 10*3/uL (ref 0.1–0.9)
Monocytes: 7 %
NEUTROS ABS: 3.9 10*3/uL (ref 1.4–7.0)
Neutrophils: 60 %
PLATELETS: 278 10*3/uL (ref 150–379)
RBC: 5.17 x10E6/uL (ref 4.14–5.80)
RDW: 14.4 % (ref 12.3–15.4)
WBC: 6.6 10*3/uL (ref 3.4–10.8)

## 2017-05-22 LAB — COMPREHENSIVE METABOLIC PANEL
A/G RATIO: 1.9 (ref 1.2–2.2)
ALT: 22 IU/L (ref 0–44)
AST: 23 IU/L (ref 0–40)
Albumin: 4.7 g/dL (ref 3.5–5.5)
Alkaline Phosphatase: 54 IU/L (ref 39–117)
BILIRUBIN TOTAL: 0.7 mg/dL (ref 0.0–1.2)
BUN/Creatinine Ratio: 13 (ref 9–20)
BUN: 16 mg/dL (ref 6–24)
CALCIUM: 9.6 mg/dL (ref 8.7–10.2)
CHLORIDE: 102 mmol/L (ref 96–106)
CO2: 25 mmol/L (ref 20–29)
Creatinine, Ser: 1.22 mg/dL (ref 0.76–1.27)
GFR, EST AFRICAN AMERICAN: 81 mL/min/{1.73_m2} (ref >59)
GFR, EST NON AFRICAN AMERICAN: 70 mL/min/{1.73_m2} (ref >59)
GLOBULIN, TOTAL: 2.5 g/dL (ref 1.5–4.5)
Glucose: 89 mg/dL (ref 65–99)
POTASSIUM: 4.6 mmol/L (ref 3.5–5.2)
SODIUM: 142 mmol/L (ref 134–144)
TOTAL PROTEIN: 7.2 g/dL (ref 6.0–8.5)

## 2017-05-22 LAB — TSH: TSH: 1.09 u[IU]/mL (ref 0.450–4.500)

## 2017-05-22 LAB — LIPID PANEL W/O CHOL/HDL RATIO
Cholesterol, Total: 213 mg/dL — ABNORMAL HIGH (ref 100–199)
HDL: 51 mg/dL (ref >39)
LDL CALC: 136 mg/dL — AB (ref 0–99)
Triglycerides: 130 mg/dL (ref 0–149)
VLDL Cholesterol Cal: 26 mg/dL (ref 5–40)

## 2017-05-22 LAB — H. PYLORI ANTIBODY, IGG: H. pylori, IgG AbS: <0.8 Index Value (ref 0.00–0.79)

## 2017-05-23 ENCOUNTER — Encounter: Payer: Self-pay | Admitting: Gastroenterology

## 2017-06-04 ENCOUNTER — Other Ambulatory Visit: Payer: Self-pay | Admitting: Family Medicine

## 2017-06-16 ENCOUNTER — Other Ambulatory Visit: Payer: Self-pay | Admitting: Unknown Physician Specialty

## 2017-06-17 ENCOUNTER — Other Ambulatory Visit: Payer: Self-pay | Admitting: Family Medicine

## 2017-06-17 MED ORDER — BENAZEPRIL HCL 40 MG PO TABS: ORAL_TABLET | ORAL | 1 refills | Status: DC

## 2017-06-17 MED ORDER — OMEPRAZOLE 40 MG PO CPDR: 40.0000 mg | DELAYED_RELEASE_CAPSULE | Freq: Every day | ORAL | 1 refills | Status: DC

## 2017-06-17 MED ORDER — MELOXICAM 15 MG PO TABS: ORAL_TABLET | ORAL | 1 refills | Status: DC

## 2017-06-18 ENCOUNTER — Ambulatory Visit: Payer: BLUE CROSS/BLUE SHIELD | Admitting: Family Medicine

## 2017-06-27 ENCOUNTER — Ambulatory Visit (INDEPENDENT_AMBULATORY_CARE_PROVIDER_SITE_OTHER): Payer: BLUE CROSS/BLUE SHIELD | Admitting: Family Medicine

## 2017-06-27 ENCOUNTER — Encounter: Payer: Self-pay | Admitting: Family Medicine

## 2017-06-27 VITALS — BP 146/89 | HR 52 | Temp 98.9°F | Wt 190.1 lb

## 2017-06-27 DIAGNOSIS — M549 Dorsalgia, unspecified: Secondary | ICD-10-CM

## 2017-06-27 DIAGNOSIS — Z23 Encounter for immunization: Secondary | ICD-10-CM

## 2017-06-27 MED ORDER — CYCLOBENZAPRINE HCL 10 MG PO TABS: 10.0000 mg | ORAL_TABLET | Freq: Three times a day (TID) | ORAL | 0 refills | Status: DC | PRN

## 2017-06-27 MED ORDER — NAPROXEN 500 MG PO TABS: 500.0000 mg | ORAL_TABLET | Freq: Two times a day (BID) | ORAL | 0 refills | Status: DC

## 2017-06-27 MED ORDER — TRAMADOL HCL 50 MG PO TABS: 50.0000 mg | ORAL_TABLET | Freq: Three times a day (TID) | ORAL | 0 refills | Status: DC | PRN

## 2017-06-27 NOTE — Patient Instructions (Addendum)
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
## 2017-07-05 ENCOUNTER — Other Ambulatory Visit: Payer: Self-pay | Admitting: Family Medicine

## 2017-07-08 ENCOUNTER — Encounter: Payer: Self-pay | Admitting: Family Medicine

## 2017-07-08 MED ORDER — NAPROXEN 500 MG PO TABS: 500.0000 mg | ORAL_TABLET | Freq: Two times a day (BID) | ORAL | 0 refills | Status: DC

## 2017-07-08 MED ORDER — CYCLOBENZAPRINE HCL 10 MG PO TABS: 10.0000 mg | ORAL_TABLET | Freq: Three times a day (TID) | ORAL | 0 refills | Status: DC | PRN

## 2017-08-23 ENCOUNTER — Encounter: Payer: Self-pay | Admitting: Family Medicine

## 2017-08-23 ENCOUNTER — Ambulatory Visit: Payer: BLUE CROSS/BLUE SHIELD | Admitting: Family Medicine

## 2017-08-23 VITALS — BP 153/88 | HR 53 | Temp 97.6°F | Wt 191.2 lb

## 2017-08-23 DIAGNOSIS — J069 Acute upper respiratory infection, unspecified: Secondary | ICD-10-CM

## 2017-08-23 MED ORDER — HYDROCOD POLST-CPM POLST ER 10-8 MG/5ML PO SUER: 5.0000 mL | Freq: Two times a day (BID) | ORAL | 0 refills | Status: DC | PRN

## 2017-08-23 MED ORDER — BENZONATATE 200 MG PO CAPS: 200.0000 mg | ORAL_CAPSULE | Freq: Two times a day (BID) | ORAL | 0 refills | Status: DC | PRN

## 2017-08-23 MED ORDER — AZITHROMYCIN 250 MG PO TABS: ORAL_TABLET | ORAL | 0 refills | Status: DC

## 2017-08-23 NOTE — Progress Notes (Signed)
   BP (!) 153/88 (BP Location: Left Arm, Patient Position: Sitting, Cuff Size: Large)   Pulse (!) 53   Temp 97.6 F (36.4 C)   Wt 191 lb 3 oz (86.7 kg)   SpO2 100%   BMI 25.71 kg/m    Subjective:    Patient ID: Brian HolmesDarin S Gillespie, male    DOB: 08-29-1971, 46 y.o.   MRN: [id number]  HPI: Brian HolmesDarin S Gillespie is a 46 y.o. male  Chief Complaint  Patient presents with  . URI    X 2 weeks, cough, nasal congestion, chest pain    2 weeks of congestion, sore throat, productive cough, facial pain and pressure, chest tightness and soreness. Some body aches, not sure if any fevers, no SOB. Trying mucinex and OTC cold medicine with no relief. No sick contacts.   Relevant past medical, surgical, family and social history reviewed and updated as indicated. Interim medical history since our last visit reviewed. Allergies and medications reviewed and updated.  Review of Systems  Constitutional: Negative.   HENT: Positive for congestion, sinus pressure, sinus pain and sore throat.   Respiratory: Positive for cough and chest tightness.   Cardiovascular: Negative.   Gastrointestinal: Negative.   Genitourinary: Negative.   Musculoskeletal: Negative.   Neurological: Negative.   Psychiatric/Behavioral: Negative.     Per HPI unless specifically indicated above     Objective:    BP (!) 153/88 (BP Location: Left Arm, Patient Position: Sitting, Cuff Size: Large)   Pulse (!) 53   Temp 97.6 F (36.4 C)   Wt 191 lb 3 oz (86.7 kg)   SpO2 100%   BMI 25.71 kg/m   Wt Readings from Last 3 Encounters:  08/23/17 191 lb 3 oz (86.7 kg)  06/27/17 190 lb 2 oz (86.2 kg)  05/21/17 184 lb 5 oz (83.6 kg)    Physical Exam  Constitutional: He is oriented to person, place, and time. He appears well-developed and well-nourished. No distress.  HENT:  Head: Atraumatic.  Right Ear: External ear normal.  Left Ear: External ear normal.  Oropharynx diffusely erythematous Nasal mucosa injected with discharge present    Eyes: Conjunctivae are normal. Pupils are equal, round, and reactive to light. No scleral icterus.  Neck: Normal range of motion. Neck supple.  Cardiovascular: Normal rate, regular rhythm and normal heart sounds.  Pulmonary/Chest: Effort normal and breath sounds normal. No respiratory distress. He has no wheezes. He has no rales.  Musculoskeletal: Normal range of motion.  Neurological: He is alert and oriented to person, place, and time.  Skin: Skin is warm and dry.  Psychiatric: He has a normal mood and affect. His behavior is normal.  Nursing note and vitals reviewed.     Assessment & Plan:   Problem List Items Addressed This Visit    None    Visit Diagnoses    Upper respiratory tract infection, unspecified type    -  Primary   Will treat with zpack, tessalon, and tussionex. Supportive care reviewed. Precautions discussed with sedation and tussionex. F/u if no improvement   Relevant Medications   azithromycin (ZITHROMAX) 250 MG tablet       Follow up plan: Return if symptoms worsen or fail to improve.

## 2017-08-23 NOTE — Patient Instructions (Signed)
Follow up as needed

## 2017-08-28 ENCOUNTER — Encounter: Payer: Self-pay | Admitting: Family Medicine

## 2017-09-09 ENCOUNTER — Other Ambulatory Visit: Payer: Self-pay | Admitting: Family Medicine

## 2017-09-23 ENCOUNTER — Ambulatory Visit: Payer: BLUE CROSS/BLUE SHIELD | Admitting: Family Medicine

## 2017-09-23 ENCOUNTER — Encounter: Payer: Self-pay | Admitting: Family Medicine

## 2017-09-23 VITALS — BP 137/96 | HR 66 | Temp 98.4°F | Wt 197.0 lb

## 2017-09-23 DIAGNOSIS — R21 Rash and other nonspecific skin eruption: Secondary | ICD-10-CM | POA: Diagnosis not present

## 2017-09-23 MED ORDER — HYDROXYZINE HCL 25 MG PO TABS: 25.0000 mg | ORAL_TABLET | Freq: Three times a day (TID) | ORAL | 0 refills | Status: DC | PRN

## 2017-09-23 MED ORDER — PREDNISONE 10 MG PO TABS: 10.0000 mg | ORAL_TABLET | Freq: Every day | ORAL | 0 refills | Status: DC

## 2017-09-23 NOTE — Progress Notes (Signed)
BP (!) 137/96   Pulse 66   Temp 98.4 F (36.9 C) (Oral)   Wt 197 lb (89.4 kg)   SpO2 100%   BMI 26.50 kg/m    Subjective:    Patient ID: Brian Gillespie, male    DOB: [birth year], 46 y.o.   MRN: [id number]  HPI: Brian Gillespie is a 46 y.o. male  Chief Complaint  Patient presents with  . Cellulitis    Back is worse, some on legs. 3 weeks ago.    Patient presents today with diffuse rash that's been present almost a month now. No new exposures, doesn't itch too badly other than on arms. States he seems to get this about once a year in fall/winter. Has been under a lot of stress the past few weeks [place] Has not tried anything at home other than a few leftover prednisone tabs he had, which temporarily cleared the rash.   Relevant past medical, surgical, family and social history reviewed and updated as indicated. Interim medical history since our last visit reviewed. Allergies and medications reviewed and updated.  Review of Systems  Constitutional: Negative.   HENT: Negative.   Eyes: Negative.   Respiratory: Negative.   Cardiovascular: Negative.   Gastrointestinal: Negative.   Genitourinary: Negative.   Musculoskeletal: Negative.   Skin: Positive for rash.  Neurological: Negative.   Psychiatric/Behavioral: Negative.    Per HPI unless specifically indicated above     Objective:    BP (!) 137/96   Pulse 66   Temp 98.4 F (36.9 C) (Oral)   Wt 197 lb (89.4 kg)   SpO2 100%   BMI 26.50 kg/m   Wt Readings from Last 3 Encounters:  09/23/17 197 lb (89.4 kg)  08/23/17 191 lb 3 oz (86.7 kg)  06/27/17 190 lb 2 oz (86.2 kg)    Physical Exam  Constitutional: He is oriented to person, place, and time. He appears well-developed and well-nourished. No distress.  HENT:  Head: Atraumatic.  Eyes: Conjunctivae are normal. Pupils are equal, round, and reactive to light. No scleral icterus.  Neck: Normal range of motion. Neck supple.  Cardiovascular: Normal  rate and normal heart sounds.  Pulmonary/Chest: Effort normal and breath sounds normal. No respiratory distress.  Musculoskeletal: Normal range of motion.  Neurological: He is alert and oriented to person, place, and time.  Skin: Skin is warm and dry. Rash (Small red papules diffusely spread across body sparing the face and hands) noted.  Psychiatric: He has a normal mood and affect. His behavior is normal.  Nursing note and vitals reviewed.  Results for orders placed or performed in visit on 05/21/17  Microscopic Examination  Result Value Ref Range   WBC, UA None seen 0 - 5 /hpf   RBC, UA 0-2 0 - 2 /hpf   Epithelial Cells (non renal) 0-10 0 - 10 /hpf   Bacteria, UA Few None seen/Few  CBC with Differential/Platelet  Result Value Ref Range   WBC 6.6 3.4 - 10.8 x10E3/uL   RBC 5.17 4.14 - 5.80 x10E6/uL   Hemoglobin 15.9 13.0 - 17.7 g/dL   Hematocrit 19.145.7 47.837.5 - 51.0 %   MCV 88 79 - 97 fL   MCH 30.8 26.6 - 33.0 pg   MCHC 34.8 31.5 - 35.7 g/dL   RDW 29.514.4 62.112.3 - 30.815.4 %   Platelets 278 150 - 379 x10E3/uL   Neutrophils 60 Not Estab. %   Lymphs 30 Not Estab. %  Monocytes 7 Not Estab. %   Eos 2 Not Estab. %   Basos 1 Not Estab. %   Neutrophils Absolute 3.9 1.4 - 7.0 x10E3/uL   Lymphocytes Absolute 2.0 0.7 - 3.1 x10E3/uL   Monocytes Absolute 0.5 0.1 - 0.9 x10E3/uL   EOS (ABSOLUTE) 0.2 0.0 - 0.4 x10E3/uL   Basophils Absolute 0.0 0.0 - 0.2 x10E3/uL   Immature Granulocytes 0 Not Estab. %   Immature Grans (Abs) 0.0 0.0 - 0.1 x10E3/uL  Comprehensive metabolic panel  Result Value Ref Range   Glucose 89 65 - 99 mg/dL   BUN 16 6 - 24 mg/dL   Creatinine, Ser 4.091.22 0.76 - 1.27 mg/dL   GFR calc non Af Amer 70 >59 mL/min/1.73   GFR calc Af Amer 81 >59 mL/min/1.73   BUN/Creatinine Ratio 13 9 - 20   Sodium 142 134 - 144 mmol/L   Potassium 4.6 3.5 - 5.2 mmol/L   Chloride 102 96 - 106 mmol/L   CO2 25 20 - 29 mmol/L   Calcium 9.6 8.7 - 10.2 mg/dL   Total Protein 7.2 6.0 - 8.5 g/dL   Albumin  4.7 3.5 - 5.5 g/dL   Globulin, Total 2.5 1.5 - 4.5 g/dL   Albumin/Globulin Ratio 1.9 1.2 - 2.2   Bilirubin Total 0.7 0.0 - 1.2 mg/dL   Alkaline Phosphatase 54 39 - 117 IU/L   AST 23 0 - 40 IU/L   ALT 22 0 - 44 IU/L  Lipid Panel w/o Chol/HDL Ratio  Result Value Ref Range   Cholesterol, Total 213 (H) 100 - 199 mg/dL   Triglycerides 811130 0 - 149 mg/dL   HDL 51 >91>39 mg/dL   VLDL Cholesterol Cal 26 5 - 40 mg/dL   LDL Calculated 478136 (H) 0 - 99 mg/dL  Microalbumin, Urine Waived  Result Value Ref Range   Microalb, Ur Waived 30 (H) 0 - 19 mg/L   Creatinine, Urine Waived 200 10 - 300 mg/dL   Microalb/Creat Ratio <30 <30 mg/g  TSH  Result Value Ref Range   TSH 1.090 0.450 - 4.500 uIU/mL  UA/M w/rflx Culture, Routine  Result Value Ref Range   Specific Gravity, UA 1.015 1.005 - 1.030   pH, UA 6.0 5.0 - 7.5   Color, UA Yellow Yellow   Appearance Ur Cloudy (A) Clear   Leukocytes, UA Negative Negative   Protein, UA Negative Negative/Trace   Glucose, UA Negative Negative   Ketones, UA Negative Negative   RBC, UA Negative Negative   Bilirubin, UA Negative Negative   Urobilinogen, Ur 0.2 0.2 - 1.0 mg/dL   Nitrite, UA Negative Negative   Microscopic Examination See below:   H. pylori antibody, IgG  Result Value Ref Range   H. pylori, IgG AbS <0.80 0.00 - 0.79 Index Value      Assessment & Plan:   Problem List Items Addressed This Visit    None    Visit Diagnoses    Rash    -  Primary   Unknown trigger, gets fairly regularly about once a year. Will treat with extended prednisone taper, hydroxyzine prn, and good moisturizer regimen       Follow up plan: Return if symptoms worsen or fail to improve.

## 2017-09-23 NOTE — Patient Instructions (Signed)
Follow up as needed

## 2017-12-17 ENCOUNTER — Other Ambulatory Visit: Payer: Self-pay | Admitting: Family Medicine

## 2018-01-03 ENCOUNTER — Other Ambulatory Visit: Payer: Self-pay | Admitting: Family Medicine

## 2018-02-17 ENCOUNTER — Other Ambulatory Visit: Payer: Self-pay | Admitting: Family Medicine

## 2018-02-20 NOTE — Telephone Encounter (Signed)
Meloxicam refill Last Refill:12/18/17  # 90 No RF Last OV: 11/08/15 PCP: Olevia Perches DO Pharmacy:[facility] [address]

## 2018-02-24 NOTE — Telephone Encounter (Signed)
LVM to call back to schedule an appt

## 2018-03-06 ENCOUNTER — Ambulatory Visit: Payer: BLUE CROSS/BLUE SHIELD | Admitting: Family Medicine

## 2018-03-06 ENCOUNTER — Encounter: Payer: Self-pay | Admitting: Family Medicine

## 2018-03-06 VITALS — BP 135/78 | HR 52 | Temp 98.6°F | Wt 197.6 lb

## 2018-03-06 DIAGNOSIS — R5383 Other fatigue: Secondary | ICD-10-CM | POA: Diagnosis not present

## 2018-03-06 DIAGNOSIS — I10 Essential (primary) hypertension: Secondary | ICD-10-CM | POA: Diagnosis not present

## 2018-03-06 DIAGNOSIS — M549 Dorsalgia, unspecified: Secondary | ICD-10-CM | POA: Diagnosis not present

## 2018-03-06 DIAGNOSIS — Z13220 Encounter for screening for lipoid disorders: Secondary | ICD-10-CM | POA: Diagnosis not present

## 2018-03-06 DIAGNOSIS — K219 Gastro-esophageal reflux disease without esophagitis: Secondary | ICD-10-CM

## 2018-03-06 LAB — UA/M W/RFLX CULTURE, ROUTINE
BILIRUBIN UA: NEGATIVE
Glucose, UA: NEGATIVE
KETONES UA: NEGATIVE
LEUKOCYTES UA: NEGATIVE
Nitrite, UA: NEGATIVE
PROTEIN UA: NEGATIVE
SPEC GRAV UA: 1.015 (ref 1.005–1.030)
Urobilinogen, Ur: 0.2 mg/dL (ref 0.2–1.0)
pH, UA: 6.5 (ref 5.0–7.5)

## 2018-03-06 LAB — MICROSCOPIC EXAMINATION: Bacteria, UA: NONE SEEN

## 2018-03-06 MED ORDER — MELOXICAM 15 MG PO TABS: 15.0000 mg | ORAL_TABLET | Freq: Every day | ORAL | 1 refills | Status: DC

## 2018-03-06 MED ORDER — OMEPRAZOLE 40 MG PO CPDR: 40.0000 mg | DELAYED_RELEASE_CAPSULE | Freq: Every day | ORAL | 1 refills | Status: DC

## 2018-03-06 MED ORDER — BENAZEPRIL HCL 40 MG PO TABS: 40.0000 mg | ORAL_TABLET | Freq: Every day | ORAL | 1 refills | Status: DC

## 2018-03-06 NOTE — Assessment & Plan Note (Signed)
Under good control. Continue current regimen. Continue to monitor. Call with any concerns. 

## 2018-03-06 NOTE — Progress Notes (Signed)
BP 135/78 (BP Location: Left Arm, Cuff Size: Normal)   Pulse (!) 52   Temp 98.6 F (37 C)   Wt 197 lb 9 oz (89.6 kg)   SpO2 100%   BMI 26.57 kg/m    Subjective:    Patient ID: Brian Gillespie, male    DOB: [birth year], 47 y.o.   MRN: [id number]  HPI: Brian Gillespie is a 47 y.o. male  Chief Complaint  Patient presents with  . Hypertension  . Gastroesophageal Reflux  . Back Pain    refill on Meloxicam   HYPERTENSION Hypertension status: controlled  Satisfied with current treatment? yes Duration of hypertension: chronic BP monitoring frequency:  not checking BP medication side effects:  no Medication compliance: excellent Previous BP meds: benzaepril Aspirin: no Recurrent headaches: no Visual changes: no Palpitations: no Dyspnea: no Chest pain: no Lower extremity edema: no Dizzy/lightheaded: no  GERD GERD control status: controlled  Satisfied with current treatment? yes Heartburn frequency: never Medication side effects: no  Medication compliance: excellent Dysphagia: no Odynophagia:  no Hematemesis: no Blood in stool: no EGD: no  BACK PAIN- doing well on the meloxicam. No concerns. Feeling well.   Relevant past medical, surgical, family and social history reviewed and updated as indicated. Interim medical history since our last visit reviewed. Allergies and medications reviewed and updated.  Review of Systems  Constitutional: Positive for fatigue. Negative for activity change, appetite change, chills, diaphoresis, fever and unexpected weight change.  Respiratory: Negative.   Cardiovascular: Negative.   Psychiatric/Behavioral: Negative.     Per HPI unless specifically indicated above     Objective:    BP 135/78 (BP Location: Left Arm, Cuff Size: Normal)   Pulse (!) 52   Temp 98.6 F (37 C)   Wt 197 lb 9 oz (89.6 kg)   SpO2 100%   BMI 26.57 kg/m   Wt Readings from Last 3 Encounters:  03/06/18 197 lb 9 oz (89.6 kg)  09/23/17 197 lb (89.4 kg)    08/23/17 191 lb 3 oz (86.7 kg)    Physical Exam  Constitutional: He is oriented to person, place, and time. He appears well-developed and well-nourished. No distress.  HENT:  Head: Normocephalic and atraumatic.  Right Ear: Hearing normal.  Left Ear: Hearing normal.  Nose: Nose normal.  Eyes: Conjunctivae and lids are normal. Right eye exhibits no discharge. Left eye exhibits no discharge. No scleral icterus.  Cardiovascular: Normal rate, regular rhythm, normal heart sounds and intact distal pulses. Exam reveals no gallop and no friction rub.  No murmur heard. Pulmonary/Chest: Effort normal and breath sounds normal. No stridor. No respiratory distress. He has no wheezes. He has no rales. He exhibits no tenderness.  Musculoskeletal: Normal range of motion.  Neurological: He is alert and oriented to person, place, and time.  Skin: Skin is warm, dry and intact. Capillary refill takes less than 2 seconds. No rash noted. He is not diaphoretic. No erythema. No pallor.  Psychiatric: He has a normal mood and affect. His speech is normal and behavior is normal. Judgment and thought content normal. Cognition and memory are normal.  Nursing note and vitals reviewed.   Results for orders placed or performed in visit on 05/21/17  Microscopic Examination  Result Value Ref Range   WBC, UA None seen 0 - 5 /hpf   RBC, UA 0-2 0 - 2 /hpf   Epithelial Cells (non renal) 0-10 0 - 10 /hpf   Bacteria, UA Few None seen/Few  CBC with Differential/Platelet  Result Value Ref Range   WBC 6.6 3.4 - 10.8 x10E3/uL   RBC 5.17 4.14 - 5.80 x10E6/uL   Hemoglobin 15.9 13.0 - 17.7 g/dL   Hematocrit 16.1 09.6 - 51.0 %   MCV 88 79 - 97 fL   MCH 30.8 26.6 - 33.0 pg   MCHC 34.8 31.5 - 35.7 g/dL   RDW 04.5 40.9 - 81.1 %   Platelets 278 150 - 379 x10E3/uL   Neutrophils 60 Not Estab. %   Lymphs 30 Not Estab. %   Monocytes 7 Not Estab. %   Eos 2 Not Estab. %   Basos 1 Not Estab. %   Neutrophils Absolute 3.9 1.4 - 7.0  x10E3/uL   Lymphocytes Absolute 2.0 0.7 - 3.1 x10E3/uL   Monocytes Absolute 0.5 0.1 - 0.9 x10E3/uL   EOS (ABSOLUTE) 0.2 0.0 - 0.4 x10E3/uL   Basophils Absolute 0.0 0.0 - 0.2 x10E3/uL   Immature Granulocytes 0 Not Estab. %   Immature Grans (Abs) 0.0 0.0 - 0.1 x10E3/uL  Comprehensive metabolic panel  Result Value Ref Range   Glucose 89 65 - 99 mg/dL   BUN 16 6 - 24 mg/dL   Creatinine, Ser 9.14 0.76 - 1.27 mg/dL   GFR calc non Af Amer 70 >59 mL/min/1.73   GFR calc Af Amer 81 >59 mL/min/1.73   BUN/Creatinine Ratio 13 9 - 20   Sodium 142 134 - 144 mmol/L   Potassium 4.6 3.5 - 5.2 mmol/L   Chloride 102 96 - 106 mmol/L   CO2 25 20 - 29 mmol/L   Calcium 9.6 8.7 - 10.2 mg/dL   Total Protein 7.2 6.0 - 8.5 g/dL   Albumin 4.7 3.5 - 5.5 g/dL   Globulin, Total 2.5 1.5 - 4.5 g/dL   Albumin/Globulin Ratio 1.9 1.2 - 2.2   Bilirubin Total 0.7 0.0 - 1.2 mg/dL   Alkaline Phosphatase 54 39 - 117 IU/L   AST 23 0 - 40 IU/L   ALT 22 0 - 44 IU/L  Lipid Panel w/o Chol/HDL Ratio  Result Value Ref Range   Cholesterol, Total 213 (H) 100 - 199 mg/dL   Triglycerides 782 0 - 149 mg/dL   HDL 51 >95 mg/dL   VLDL Cholesterol Cal 26 5 - 40 mg/dL   LDL Calculated 621 (H) 0 - 99 mg/dL  Microalbumin, Urine Waived  Result Value Ref Range   Microalb, Ur Waived 30 (H) 0 - 19 mg/L   Creatinine, Urine Waived 200 10 - 300 mg/dL   Microalb/Creat Ratio <30 <30 mg/g  TSH  Result Value Ref Range   TSH 1.090 0.450 - 4.500 uIU/mL  UA/M w/rflx Culture, Routine  Result Value Ref Range   Specific Gravity, UA 1.015 1.005 - 1.030   pH, UA 6.0 5.0 - 7.5   Color, UA Yellow Yellow   Appearance Ur Cloudy (A) Clear   Leukocytes, UA Negative Negative   Protein, UA Negative Negative/Trace   Glucose, UA Negative Negative   Ketones, UA Negative Negative   RBC, UA Negative Negative   Bilirubin, UA Negative Negative   Urobilinogen, Ur 0.2 0.2 - 1.0 mg/dL   Nitrite, UA Negative Negative   Microscopic Examination See below:     H. pylori antibody, IgG  Result Value Ref Range   H. pylori, IgG AbS <0.80 0.00 - 0.79 Index Value      Assessment & Plan:   Problem List Items Addressed This Visit      Cardiovascular and  Mediastinum   HTN (hypertension) - Primary    Under good control. Continue current regimen. Continue to monitor. Call with any concerns.       Relevant Medications   benazepril (LOTENSIN) 40 MG tablet   Other Relevant Orders   CBC with Differential/Platelet   Comprehensive metabolic panel   UA/M w/rflx Culture, Routine    Other Visit Diagnoses    Upper back pain on left side       Refill of meloxicam given. Discussed glucosamine. Call with any concerns.    Relevant Medications   meloxicam (MOBIC) 15 MG tablet   Other Relevant Orders   CBC with Differential/Platelet   Comprehensive metabolic panel   Gastroesophageal reflux disease, esophagitis presence not specified       Feeling well, no symptoms. Will stop his omeprazole and take PRN.   Relevant Medications   omeprazole (PRILOSEC) 40 MG capsule   Other Relevant Orders   CBC with Differential/Platelet   Comprehensive metabolic panel   Fatigue, unspecified type        but sick. Will refill her medicines and recheck after ER visit.    Relevant Orders   CBC with Differential/Platelet   Comprehensive metabolic panel   VITAMIN D 25 Hydroxy (Vit-D Deficiency, Fractures)   Testosterone, free, total([facility])   Screening for cholesterol level       Labs drawn today, await results.   Relevant Orders   Lipid Panel w/o Chol/HDL Ratio       Follow up plan: Return in about 6 months (around 09/05/2018) for Physical.

## 2018-03-08 LAB — CBC WITH DIFFERENTIAL/PLATELET
Basophils Absolute: 0 10*3/uL (ref 0.0–0.2)
Basos: 1 %
EOS (ABSOLUTE): 0.1 10*3/uL (ref 0.0–0.4)
Eos: 2 %
Hematocrit: 42.3 % (ref 37.5–51.0)
Hemoglobin: 15 g/dL (ref 13.0–17.7)
IMMATURE GRANULOCYTES: 0 %
Immature Grans (Abs): 0 10*3/uL (ref 0.0–0.1)
LYMPHS ABS: 2.1 10*3/uL (ref 0.7–3.1)
Lymphs: 37 %
MCH: 30.4 pg (ref 26.6–33.0)
MCHC: 35.5 g/dL (ref 31.5–35.7)
MCV: 86 fL (ref 79–97)
MONOS ABS: 0.4 10*3/uL (ref 0.1–0.9)
Monocytes: 8 %
NEUTROS PCT: 52 %
Neutrophils Absolute: 2.9 10*3/uL (ref 1.4–7.0)
Platelets: 265 10*3/uL (ref 150–450)
RBC: 4.93 x10E6/uL (ref 4.14–5.80)
RDW: 13.8 % (ref 12.3–15.4)
WBC: 5.6 10*3/uL (ref 3.4–10.8)

## 2018-03-08 LAB — COMPREHENSIVE METABOLIC PANEL
A/G RATIO: 1.9 (ref 1.2–2.2)
ALK PHOS: 57 IU/L (ref 39–117)
ALT: 28 IU/L (ref 0–44)
AST: 23 IU/L (ref 0–40)
Albumin: 4.4 g/dL (ref 3.5–5.5)
BUN/Creatinine Ratio: 19 (ref 9–20)
BUN: 17 mg/dL (ref 6–24)
Bilirubin Total: 0.3 mg/dL (ref 0.0–1.2)
CALCIUM: 9.8 mg/dL (ref 8.7–10.2)
CO2: 23 mmol/L (ref 20–29)
CREATININE: 0.9 mg/dL (ref 0.76–1.27)
Chloride: 102 mmol/L (ref 96–106)
GFR calc Af Amer: 116 mL/min/{1.73_m2} (ref >59)
GFR, EST NON AFRICAN AMERICAN: 101 mL/min/{1.73_m2} (ref >59)
GLOBULIN, TOTAL: 2.3 g/dL (ref 1.5–4.5)
Glucose: 79 mg/dL (ref 65–99)
POTASSIUM: 4.3 mmol/L (ref 3.5–5.2)
SODIUM: 139 mmol/L (ref 134–144)
Total Protein: 6.7 g/dL (ref 6.0–8.5)

## 2018-03-08 LAB — LIPID PANEL W/O CHOL/HDL RATIO
CHOLESTEROL TOTAL: 206 mg/dL — AB (ref 100–199)
HDL: 45 mg/dL (ref >39)
LDL Calculated: 90 mg/dL (ref 0–99)
TRIGLYCERIDES: 354 mg/dL — AB (ref 0–149)
VLDL Cholesterol Cal: 71 mg/dL — ABNORMAL HIGH (ref 5–40)

## 2018-03-08 LAB — TESTOSTERONE, FREE, TOTAL, SHBG
SEX HORMONE BINDING: 74.7 nmol/L — AB (ref 16.5–55.9)
Testosterone, Free: 10.7 pg/mL (ref 6.8–21.5)
Testosterone: 636 ng/dL (ref 264–916)

## 2018-03-08 LAB — VITAMIN D 25 HYDROXY (VIT D DEFICIENCY, FRACTURES): VIT D 25 HYDROXY: 39.2 ng/mL (ref 30.0–100.0)

## 2018-04-05 ENCOUNTER — Other Ambulatory Visit: Payer: Self-pay | Admitting: Family Medicine

## 2018-07-03 DIAGNOSIS — Z23 Encounter for immunization: Secondary | ICD-10-CM | POA: Diagnosis not present

## 2018-07-07 DIAGNOSIS — S46812A Strain of other muscles, fascia and tendons at shoulder and upper arm level, left arm, initial encounter: Secondary | ICD-10-CM | POA: Diagnosis not present

## 2018-07-07 DIAGNOSIS — M542 Cervicalgia: Secondary | ICD-10-CM | POA: Diagnosis not present

## 2018-08-11 ENCOUNTER — Other Ambulatory Visit: Payer: Self-pay | Admitting: Family Medicine

## 2018-08-12 NOTE — Telephone Encounter (Signed)
Requested Prescriptions  Pending Prescriptions Disp Refills  . meloxicam (MOBIC) 15 MG tablet [Pharmacy Med Name: MELOXICAM 15MG  TABLETS] 90 tablet 0    Sig: TAKE 1 TABLET(15 MG) BY MOUTH DAILY     Analgesics:  COX2 Inhibitors Passed - 08/11/2018  7:04 AM      Passed - HGB in normal range and within 360 days    Hemoglobin  Date Value Ref Range Status  03/06/2018 15.0 13.0 - 17.7 g/dL Final         Passed - Cr in normal range and within 360 days    Creatinine, Ser  Date Value Ref Range Status  03/06/2018 0.90 0.76 - 1.27 mg/dL Final         Passed - Patient is not pregnant      Passed - Valid encounter within last 12 months    Recent Outpatient Visits          5 months ago Essential hypertension   [facility] [place]   10 months ago [facility]   [facility] [place] [address], [place]   11 months ago Upper respiratory tract infection, unspecified type   [facility] [place], [place]   1 year ago Upper back pain on left side   [facility] [place] [place] [place]   1 year ago Routine general medical examination at a health care facility   [facility] [place], [place], [place]

## 2018-10-13 ENCOUNTER — Other Ambulatory Visit: Payer: Self-pay | Admitting: Family Medicine

## 2018-10-14 NOTE — Telephone Encounter (Signed)
Pt request refill on benazepril; last office visit 03/06/18; no upcoming visits noted; contacted pt and made him aware; pt offered and accepted appointment with Dr Olevia Perches, [facility], 10/23/2027 at 0930; he verbalized understanding; will refill medication per protocol; will also route to office for notification.  Requested Prescriptions  Pending Prescriptions Disp Refills  . benazepril (LOTENSIN) 40 MG tablet [Pharmacy Med Name: BENAZEPRIL 40MG  TABLETS] 90 tablet 1    Sig: TAKE 1 TABLET(40 MG) BY MOUTH DAILY     Cardiovascular:  ACE Inhibitors Failed - 10/13/2018  9:57 PM      Failed - Cr in normal range and within 180 days    Creatinine, Ser  Date Value Ref Range Status  03/06/2018 0.90 0.76 - 1.27 mg/dL Final         Failed - K in normal range and within 180 days    Potassium  Date Value Ref Range Status  03/06/2018 4.3 3.5 - 5.2 mmol/L Final         Failed - Valid encounter within last 6 months    Recent Outpatient Visits          7 months ago Essential hypertension   [facility] [place], [place], DO   1 year ago Rash   [facility] Particia [place], [place]   1 year ago Upper respiratory tract infection, unspecified type   [facility], [place], [place]   1 year ago Upper back pain on left side   [facility] [place], [place], DO   1 year ago Routine general medical examination at a health care facility   [facility] [place], [place], DO      Future Appointments            In 1 week [place] Benes, Megan P, DO [facility]           Passed - Patient is not pregnant      Passed - Last BP in normal range    BP Readings from Last 1 Encounters:  03/06/18 135/78

## 2018-10-21 NOTE — Progress Notes (Signed)
BP 139/84 (BP Location: Left Arm, Patient Position: Sitting, Cuff Size: Large)   Pulse (!) 52   Temp 97.7 F (36.5 C)   Ht 6' 0.3" (1.836 m)   Wt 203 lb 9 oz (92.3 kg)   SpO2 100%   BMI 27.38 kg/m    Subjective:    Patient ID: Brian Gillespie, male    DOB: 1971/08/31, 48 y.o.   MRN: [id number]  HPI: Brian Gillespie is a 48 y.o. male  Chief Complaint  Patient presents with  . Hypertension  . Gastroesophageal Reflux    Patient does not need a refill at this time   HYPERTENSION Hypertension status: controlled  Satisfied with current treatment? yes Duration of hypertension: chronic BP monitoring frequency:  not checking BP medication side effects:  no Medication compliance: excellent compliance Previous BP meds: benazepril Aspirin: no Recurrent headaches: no Visual changes: no Palpitations: no Dyspnea: no Chest pain: no Lower extremity edema: no Dizzy/lightheaded: no   Relevant past medical, surgical, family and social history reviewed and updated as indicated. Interim medical history since our last visit reviewed. Allergies and medications reviewed and updated.  Review of Systems  Constitutional: Negative.   Respiratory: Negative.   Cardiovascular: Negative.   Musculoskeletal: Negative.   Neurological: Negative.   Psychiatric/Behavioral: Negative.     Per HPI unless specifically indicated above     Objective:    BP 139/84 (BP Location: Left Arm, Patient Position: Sitting, Cuff Size: Large)   Pulse (!) 52   Temp 97.7 F (36.5 C)   Ht 6' 0.3" (1.836 m)   Wt 203 lb 9 oz (92.3 kg)   SpO2 100%   BMI 27.38 kg/m   Wt Readings from Last 3 Encounters:  10/22/18 203 lb 9 oz (92.3 kg)  03/06/18 197 lb 9 oz (89.6 kg)  09/23/17 197 lb (89.4 kg)    Physical Exam Vitals signs and nursing note reviewed.  Constitutional:      General: He is not in acute distress.    Appearance: Normal appearance. He is not ill-appearing, toxic-appearing or diaphoretic.  HENT:     Head: Normocephalic and atraumatic.     Right Ear: External ear normal.     Left Ear: External ear normal.     Nose: Nose normal.     Mouth/Throat:     Mouth: Mucous membranes are moist.     Pharynx: Oropharynx is clear.  Eyes:     General: No scleral icterus.       Right eye: No discharge.        Left eye: No discharge.     Extraocular Movements: Extraocular movements intact.     Conjunctiva/sclera: Conjunctivae normal.     Pupils: Pupils are equal, round, and reactive to light.  Neck:     Musculoskeletal: Normal range of motion and neck supple.  Cardiovascular:     Rate and Rhythm: Normal rate and regular rhythm.     Pulses: Normal pulses.     Heart sounds: Normal heart sounds. No murmur. No friction rub. No gallop.   Pulmonary:     Effort: Pulmonary effort is normal. No respiratory distress.     Breath sounds: Normal breath sounds. No stridor. No wheezing, rhonchi or rales.  Chest:     Chest wall: No tenderness.  Musculoskeletal: Normal range of motion.  Skin:    General: Skin is warm and dry.     Capillary Refill: Capillary refill takes less than 2 seconds.  Coloration: Skin is not jaundiced or pale.     Findings: No bruising, erythema, lesion or rash.  Neurological:     General: No focal deficit present.     Mental Status: He is alert and oriented to person, place, and time. Mental status is at baseline.  Psychiatric:        Mood and Affect: Mood normal.        Behavior: Behavior normal.        Thought Content: Thought content normal.        Judgment: Judgment normal.     Results for orders placed or performed in visit on 03/06/18  Microscopic Examination  Result Value Ref Range   WBC, UA 0-5 0 - 5 /hpf   RBC, UA 0-2 0 - 2 /hpf   Epithelial Cells (non renal) CANCELED    Bacteria, UA None seen None seen/Few  CBC with Differential/Platelet  Result Value Ref Range   WBC 5.6 3.4 - 10.8 x10E3/uL   RBC 4.93 4.14 - 5.80 x10E6/uL   Hemoglobin 15.0 13.0 - 17.7  g/dL   Hematocrit 16.142.3 09.637.5 - 51.0 %   MCV 86 79 - 97 fL   MCH 30.4 26.6 - 33.0 pg   MCHC 35.5 31.5 - 35.7 g/dL   RDW 04.513.8 40.912.3 - 81.115.4 %   Platelets 265 150 - 450 x10E3/uL   Neutrophils 52 Not Estab. %   Lymphs 37 Not Estab. %   Monocytes 8 Not Estab. %   Eos 2 Not Estab. %   Basos 1 Not Estab. %   Neutrophils Absolute 2.9 1.4 - 7.0 x10E3/uL   Lymphocytes Absolute 2.1 0.7 - 3.1 x10E3/uL   Monocytes Absolute 0.4 0.1 - 0.9 x10E3/uL   EOS (ABSOLUTE) 0.1 0.0 - 0.4 x10E3/uL   Basophils Absolute 0.0 0.0 - 0.2 x10E3/uL   Immature Granulocytes 0 Not Estab. %   Immature Grans (Abs) 0.0 0.0 - 0.1 x10E3/uL  Comprehensive metabolic panel  Result Value Ref Range   Glucose 79 65 - 99 mg/dL   BUN 17 6 - 24 mg/dL   Creatinine, Ser 9.140.90 0.76 - 1.27 mg/dL   GFR calc non Af Amer 101 >59 mL/min/1.73   GFR calc Af Amer 116 >59 mL/min/1.73   BUN/Creatinine Ratio 19 9 - 20   Sodium 139 134 - 144 mmol/L   Potassium 4.3 3.5 - 5.2 mmol/L   Chloride 102 96 - 106 mmol/L   CO2 23 20 - 29 mmol/L   Calcium 9.8 8.7 - 10.2 mg/dL   Total Protein 6.7 6.0 - 8.5 g/dL   Albumin 4.4 3.5 - 5.5 g/dL   Globulin, Total 2.3 1.5 - 4.5 g/dL   Albumin/Globulin Ratio 1.9 1.2 - 2.2   Bilirubin Total 0.3 0.0 - 1.2 mg/dL   Alkaline Phosphatase 57 39 - 117 IU/L   AST 23 0 - 40 IU/L   ALT 28 0 - 44 IU/L  Lipid Panel w/o Chol/HDL Ratio  Result Value Ref Range   Cholesterol, Total 206 (H) 100 - 199 mg/dL   Triglycerides 782354 (H) 0 - 149 mg/dL   HDL 45 >95>39 mg/dL   VLDL Cholesterol Cal 71 (H) 5 - 40 mg/dL   LDL Calculated 90 0 - 99 mg/dL  UA/M w/rflx Culture, Routine  Result Value Ref Range   Specific Gravity, UA 1.015 1.005 - 1.030   pH, UA 6.5 5.0 - 7.5   Color, UA Yellow Yellow   Appearance Ur Clear Clear   Leukocytes, UA  Negative Negative   Protein, UA Negative Negative/Trace   Glucose, UA Negative Negative   Ketones, UA Negative Negative   RBC, UA Trace (A) Negative   Bilirubin, UA Negative Negative    Urobilinogen, Ur 0.2 0.2 - 1.0 mg/dL   Nitrite, UA Negative Negative   Microscopic Examination See below:   VITAMIN D 25 Hydroxy (Vit-D Deficiency, Fractures)  Result Value Ref Range   Vit D, 25-Hydroxy 39.2 30.0 - 100.0 ng/mL  Testosterone, free, total([facility])  Result Value Ref Range   Testosterone 636 264 - 916 ng/dL   Testosterone, Free 78.5 6.8 - 21.5 pg/mL   Sex Hormone Binding 74.7 (H) 16.5 - 55.9 nmol/L      Assessment & Plan:   Problem List Items Addressed This Visit      Cardiovascular and Mediastinum   HTN (hypertension) - Primary    Under good control on current regimen. Continue current regimen. Continue to monitor. Call with any concerns. Refills given. Labs checked today.       Relevant Medications   benazepril (LOTENSIN) 40 MG tablet   Other Relevant Orders   Basic metabolic panel       Follow up plan: Return in about 6 months (around 04/22/2019) for Physical.

## 2018-10-22 ENCOUNTER — Ambulatory Visit: Payer: PRIVATE HEALTH INSURANCE | Admitting: Family Medicine

## 2018-10-22 ENCOUNTER — Encounter: Payer: Self-pay | Admitting: Family Medicine

## 2018-10-22 ENCOUNTER — Other Ambulatory Visit: Payer: Self-pay

## 2018-10-22 VITALS — BP 139/84 | HR 52 | Temp 97.7°F | Ht 72.3 in | Wt 203.6 lb

## 2018-10-22 DIAGNOSIS — I10 Essential (primary) hypertension: Secondary | ICD-10-CM

## 2018-10-22 MED ORDER — MELOXICAM 15 MG PO TABS: ORAL_TABLET | ORAL | 1 refills | Status: DC

## 2018-10-22 MED ORDER — BENAZEPRIL HCL 40 MG PO TABS: ORAL_TABLET | ORAL | 1 refills | Status: DC

## 2018-10-22 NOTE — Assessment & Plan Note (Signed)
Under good control on current regimen. Continue current regimen. Continue to monitor. Call with any concerns. Refills given. Labs checked today.  

## 2018-10-23 LAB — BASIC METABOLIC PANEL
BUN / CREAT RATIO: 16 (ref 9–20)
BUN: 17 mg/dL (ref 6–24)
CHLORIDE: 105 mmol/L (ref 96–106)
CO2: 23 mmol/L (ref 20–29)
Calcium: 9.6 mg/dL (ref 8.7–10.2)
Creatinine, Ser: 1.04 mg/dL (ref 0.76–1.27)
GFR calc Af Amer: 97 mL/min/{1.73_m2} (ref >59)
GFR calc non Af Amer: 84 mL/min/{1.73_m2} (ref >59)
GLUCOSE: 84 mg/dL (ref 65–99)
POTASSIUM: 4.7 mmol/L (ref 3.5–5.2)
SODIUM: 142 mmol/L (ref 134–144)

## 2019-05-03 ENCOUNTER — Other Ambulatory Visit: Payer: Self-pay | Admitting: Family Medicine

## 2019-05-04 NOTE — Telephone Encounter (Signed)
Phone call to patient, advised him he is due for physical per Dr. Durenda Age last office visit note.  30 courtesy refill sent.    Requested Prescriptions  Pending Prescriptions Disp Refills  . meloxicam (MOBIC) 15 MG tablet [Pharmacy Med Name: MELOXICAM 15MG  TABLETS] 30 tablet 0    Sig: TAKE 1 TABLET(15 MG) BY MOUTH DAILY     Analgesics:  COX2 Inhibitors Failed - 05/03/2019  9:03 PM      Failed - HGB in normal range and within 360 days    Hemoglobin  Date Value Ref Range Status  03/06/2018 15.0 13.0 - 17.7 g/dL Final         Passed - Cr in normal range and within 360 days    Creatinine, Ser  Date Value Ref Range Status  10/22/2018 1.04 0.76 - 1.27 mg/dL Final         Passed - Patient is not pregnant      Passed - Valid encounter within last 12 months    Recent Outpatient Visits          6 months ago Essential hypertension   Harold, [place], DO   1 year ago Essential hypertension   [place], [place], DO   1 year ago [place], Rachel [place], [place]   1 year ago Upper respiratory tract infection, unspecified type   [facility], [place], [place]   1 year ago Upper back pain on left side   [facility], Megan P, DO             . benazepril (LOTENSIN) 40 MG tablet [Pharmacy Med Name: BENAZEPRIL 40MG  TABLETS] 30 tablet 0    Sig: TAKE 1 TABLET(40 MG) BY MOUTH DAILY     Cardiovascular:  ACE Inhibitors Failed - 05/03/2019  9:03 PM      Failed - Cr in normal range and within 180 days    Creatinine, Ser  Date Value Ref Range Status  10/22/2018 1.04 0.76 - 1.27 mg/dL Final         Failed - K in normal range and within 180 days    Potassium  Date Value Ref Range Status  10/22/2018 4.7 3.5 - 5.2 mmol/L Final         Failed - Valid encounter within last 6 months    Recent Outpatient Visits          6 months ago Essential hypertension   Cochran, Bradford P, DO   1 year ago Essential hypertension   [place], [place], DO   1 year ago [place], [place], [place]   1 year ago Upper respiratory tract infection, unspecified type   [facility], [place], [place]   1 year ago Upper back pain on left side   [place], [place], DO             Passed - Patient is not pregnant      Passed - Last BP in normal range    BP Readings from Last 1 Encounters:  10/22/18 139/84

## 2019-05-31 ENCOUNTER — Other Ambulatory Visit: Payer: Self-pay | Admitting: Family Medicine

## 2019-06-02 NOTE — Telephone Encounter (Signed)
Routing to provider. See [facility] note below, upcoming appointment 06/16/19

## 2019-06-02 NOTE — Telephone Encounter (Signed)
Requested medication (s) are due for refill today: yes  Requested medication (s) are on the active medication list: yes  Last refill: 05/04/2019  Future visit scheduled: yes  Notes to clinic:  Review for refill   Requested Prescriptions  Pending Prescriptions Disp Refills   benazepril (LOTENSIN) 40 MG tablet [Pharmacy Med Name: BENAZEPRIL 40MG  TABLETS] 30 tablet 0    Sig: TAKE 1 TABLET(40 MG) BY MOUTH DAILY     Cardiovascular:  ACE Inhibitors Failed - 05/31/2019 10:23 AM      Failed - Cr in normal range and within 180 days    Creatinine, Ser  Date Value Ref Range Status  10/22/2018 1.04 0.76 - 1.27 mg/dL Final         Failed - K in normal range and within 180 days    Potassium  Date Value Ref Range Status  10/22/2018 4.7 3.5 - 5.2 mmol/L Final         Failed - Valid encounter within last 6 months    Recent Outpatient Visits          7 months ago Essential hypertension   [place], [place], DO   1 year ago Essential hypertension   [place], [place], DO   1 year ago [place], [place], [place]   1 year ago Upper respiratory tract infection, unspecified type   [facility], [place], [place]   1 year ago Upper back pain on left side   [facility] [place], [place], DO      Future Appointments            In 2 weeks Johnson, Megan P, DO [place], [place] - Patient is not pregnant      Passed - Last BP in normal range    BP Readings from Last 1 Encounters:  10/22/18 139/84          meloxicam (MOBIC) 15 MG tablet [Pharmacy Med Name: MELOXICAM 15MG  TABLETS] 30 tablet 0    Sig: TAKE 1 TABLET(15 MG) BY MOUTH DAILY     Analgesics:  COX2 Inhibitors Failed - 05/31/2019 10:23 AM      Failed - HGB in normal range and within 360 days    Hemoglobin  Date Value Ref Range Status  03/06/2018 15.0 13.0 - 17.7 g/dL Final        Passed - Cr in normal range and within 360 days    Creatinine, Ser  Date Value Ref Range Status  10/22/2018 1.04 0.76 - 1.27 mg/dL Final         Passed - Patient is not pregnant      Passed - Valid encounter within last 12 months    Recent Outpatient Visits          7 months ago Essential hypertension   [place], [place], DO   1 year ago Essential hypertension   [place], [place], DO   1 year ago [place], [place], [place]   1 year ago Upper respiratory tract infection, unspecified type   [facility], [place], [place]   1 year ago Upper back pain on left side   [facility] [place], [place], DO      Future Appointments  In 2 weeks Laural BenesJohnson, Oralia RudMegan P, DO [facility]

## 2019-06-16 ENCOUNTER — Ambulatory Visit (INDEPENDENT_AMBULATORY_CARE_PROVIDER_SITE_OTHER): Payer: PRIVATE HEALTH INSURANCE | Admitting: Family Medicine

## 2019-06-16 ENCOUNTER — Other Ambulatory Visit: Payer: Self-pay

## 2019-06-16 ENCOUNTER — Encounter: Payer: Self-pay | Admitting: Family Medicine

## 2019-06-16 VITALS — BP 136/84 | HR 49 | Temp 97.7°F | Ht 72.0 in | Wt 196.0 lb

## 2019-06-16 DIAGNOSIS — Z13220 Encounter for screening for lipoid disorders: Secondary | ICD-10-CM | POA: Diagnosis not present

## 2019-06-16 DIAGNOSIS — I10 Essential (primary) hypertension: Secondary | ICD-10-CM

## 2019-06-16 LAB — UA/M W/RFLX CULTURE, ROUTINE
Bilirubin, UA: NEGATIVE
Glucose, UA: NEGATIVE
Leukocytes,UA: NEGATIVE
Nitrite, UA: NEGATIVE
Protein,UA: NEGATIVE
RBC, UA: NEGATIVE
Specific Gravity, UA: 1.025 (ref 1.005–1.030)
Urobilinogen, Ur: 0.2 mg/dL (ref 0.2–1.0)
pH, UA: 6 (ref 5.0–7.5)

## 2019-06-16 LAB — MICROALBUMIN, URINE WAIVED
Creatinine, Urine Waived: 300 mg/dL (ref 10–300)
Microalb, Ur Waived: 30 mg/L — ABNORMAL HIGH (ref 0–19)
Microalb/Creat Ratio: <30 mg/g (ref <30)

## 2019-06-16 MED ORDER — MELOXICAM 15 MG PO TABS: 15.0000 mg | ORAL_TABLET | Freq: Every day | ORAL | 1 refills | Status: DC

## 2019-06-16 MED ORDER — BENAZEPRIL HCL 40 MG PO TABS: 40.0000 mg | ORAL_TABLET | Freq: Every day | ORAL | 1 refills | Status: DC

## 2019-06-16 MED ORDER — OMEPRAZOLE 40 MG PO CPDR: 40.0000 mg | DELAYED_RELEASE_CAPSULE | Freq: Every day | ORAL | 1 refills | Status: DC

## 2019-06-16 NOTE — Assessment & Plan Note (Signed)
Under good control on current regimen. Continue current regimen. Continue to monitor. Call with any concerns. Refills given. Labs drawn today.   

## 2019-06-16 NOTE — Progress Notes (Signed)
BP 136/84   Pulse (!) 49   Temp 97.7 F (36.5 C) (Oral)   Ht 6' (1.829 m)   Wt 196 lb (88.9 kg)   SpO2 99%   BMI 26.58 kg/m    Subjective:    Patient ID: Brian Gillespie, male    DOB: Nov 22, 1970, 48 y.o.   MRN: [id number]  HPI: Brian Gillespie is a 48 y.o. male  Chief Complaint  Patient presents with  . Hypertension    benazepril refill   HYPERTENSION Hypertension status: controlled  Satisfied with current treatment? yes Duration of hypertension: chronic BP monitoring frequency:  not checking BP medication side effects:  no Medication compliance: excellent compliance Previous BP meds: benazepril Aspirin: no Recurrent headaches: no Visual changes: no Palpitations: no Dyspnea: no Chest pain: no Lower extremity edema: no Dizzy/lightheaded: no  Relevant past medical, surgical, family and social history reviewed and updated as indicated. Interim medical history since our last visit reviewed. Allergies and medications reviewed and updated.  Review of Systems  Constitutional: Negative.   Respiratory: Negative.   Cardiovascular: Negative.   Musculoskeletal: Negative.   Psychiatric/Behavioral: Negative.     Per HPI unless specifically indicated above     Objective:    BP 136/84   Pulse (!) 49   Temp 97.7 F (36.5 C) (Oral)   Ht 6' (1.829 m)   Wt 196 lb (88.9 kg)   SpO2 99%   BMI 26.58 kg/m   Wt Readings from Last 3 Encounters:  06/16/19 196 lb (88.9 kg)  10/22/18 203 lb 9 oz (92.3 kg)  03/06/18 197 lb 9 oz (89.6 kg)    Physical Exam Vitals signs and nursing note reviewed.  Constitutional:      General: He is not in acute distress.    Appearance: Normal appearance. He is not ill-appearing, toxic-appearing or diaphoretic.  HENT:     Head: Normocephalic and atraumatic.     Right Ear: External ear normal.     Left Ear: External ear normal.     Nose: Nose normal.     Mouth/Throat:     Mouth: Mucous membranes are moist.     Pharynx: Oropharynx is clear.   Eyes:     General: No scleral icterus.       Right eye: No discharge.        Left eye: No discharge.     Extraocular Movements: Extraocular movements intact.     Conjunctiva/sclera: Conjunctivae normal.     Pupils: Pupils are equal, round, and reactive to light.  Neck:     Musculoskeletal: Normal range of motion and neck supple.  Cardiovascular:     Rate and Rhythm: Normal rate and regular rhythm.     Pulses: Normal pulses.     Heart sounds: Normal heart sounds. No murmur. No friction rub. No gallop.   Pulmonary:     Effort: Pulmonary effort is normal. No respiratory distress.     Breath sounds: Normal breath sounds. No stridor. No wheezing, rhonchi or rales.  Chest:     Chest wall: No tenderness.  Musculoskeletal: Normal range of motion.  Skin:    General: Skin is warm and dry.     Capillary Refill: Capillary refill takes less than 2 seconds.     Coloration: Skin is not jaundiced or pale.     Findings: No bruising, erythema, lesion or rash.  Neurological:     General: No focal deficit present.     Mental Status: He is alert  and oriented to person, place, and time. Mental status is at baseline.  Psychiatric:        Mood and Affect: Mood normal.        Behavior: Behavior normal.        Thought Content: Thought content normal.        Judgment: Judgment normal.     Results for orders placed or performed in visit on 38/10/17  Basic metabolic panel  Result Value Ref Range   Glucose 84 65 - 99 mg/dL   BUN 17 6 - 24 mg/dL   Creatinine, Ser 1.04 0.76 - 1.27 mg/dL   GFR calc non Af Amer 84 >59 mL/min/1.73   GFR calc Af Amer 97 >59 mL/min/1.73   BUN/Creatinine Ratio 16 9 - 20   Sodium 142 134 - 144 mmol/L   Potassium 4.7 3.5 - 5.2 mmol/L   Chloride 105 96 - 106 mmol/L   CO2 23 20 - 29 mmol/L   Calcium 9.6 8.7 - 10.2 mg/dL      Assessment & Plan:   Problem List Items Addressed This Visit      Cardiovascular and Mediastinum   HTN (hypertension) - Primary    Under good  control on current regimen. Continue current regimen. Continue to monitor. Call with any concerns. Refills given. Labs drawn today.       Relevant Medications   benazepril (LOTENSIN) 40 MG tablet   Other Relevant Orders   CBC with Differential/Platelet   Comprehensive metabolic panel   Microalbumin, Urine Waived   TSH   UA/M w/rflx Culture, Routine    Other Visit Diagnoses    Screening for cholesterol level       Labs drawn today. Await results.    Relevant Orders   Lipid Panel w/o Chol/HDL Ratio       Follow up plan: Return in about 6 months (around 12/14/2019) for Physical.

## 2019-06-17 LAB — CBC WITH DIFFERENTIAL/PLATELET
Basophils Absolute: 0.1 10*3/uL (ref 0.0–0.2)
Basos: 1 %
EOS (ABSOLUTE): 0.1 10*3/uL (ref 0.0–0.4)
Eos: 2 %
Hematocrit: 44.2 % (ref 37.5–51.0)
Hemoglobin: 15.4 g/dL (ref 13.0–17.7)
Immature Grans (Abs): 0 10*3/uL (ref 0.0–0.1)
Immature Granulocytes: 0 %
Lymphocytes Absolute: 2.2 10*3/uL (ref 0.7–3.1)
Lymphs: 39 %
MCH: 30.1 pg (ref 26.6–33.0)
MCHC: 34.8 g/dL (ref 31.5–35.7)
MCV: 87 fL (ref 79–97)
Monocytes Absolute: 0.6 10*3/uL (ref 0.1–0.9)
Monocytes: 11 %
Neutrophils Absolute: 2.7 10*3/uL (ref 1.4–7.0)
Neutrophils: 47 %
Platelets: 278 10*3/uL (ref 150–450)
RBC: 5.11 x10E6/uL (ref 4.14–5.80)
RDW: 13.1 % (ref 11.6–15.4)
WBC: 5.7 10*3/uL (ref 3.4–10.8)

## 2019-06-17 LAB — COMPREHENSIVE METABOLIC PANEL
ALT: 22 IU/L (ref 0–44)
AST: 27 IU/L (ref 0–40)
Albumin/Globulin Ratio: 2.2 (ref 1.2–2.2)
Albumin: 4.8 g/dL (ref 4.0–5.0)
Alkaline Phosphatase: 66 IU/L (ref 39–117)
BUN/Creatinine Ratio: 20 (ref 9–20)
BUN: 23 mg/dL (ref 6–24)
Bilirubin Total: 0.5 mg/dL (ref 0.0–1.2)
CO2: 24 mmol/L (ref 20–29)
Calcium: 9.7 mg/dL (ref 8.7–10.2)
Chloride: 102 mmol/L (ref 96–106)
Creatinine, Ser: 1.13 mg/dL (ref 0.76–1.27)
GFR calc Af Amer: 88 mL/min/{1.73_m2} (ref >59)
GFR calc non Af Amer: 76 mL/min/{1.73_m2} (ref >59)
Globulin, Total: 2.2 g/dL (ref 1.5–4.5)
Glucose: 87 mg/dL (ref 65–99)
Potassium: 4.3 mmol/L (ref 3.5–5.2)
Sodium: 141 mmol/L (ref 134–144)
Total Protein: 7 g/dL (ref 6.0–8.5)

## 2019-06-17 LAB — LIPID PANEL W/O CHOL/HDL RATIO
Cholesterol, Total: 198 mg/dL (ref 100–199)
HDL: 48 mg/dL (ref >39)
LDL Chol Calc (NIH): 124 mg/dL — ABNORMAL HIGH (ref 0–99)
Triglycerides: 148 mg/dL (ref 0–149)
VLDL Cholesterol Cal: 26 mg/dL (ref 5–40)

## 2019-06-17 LAB — TSH: TSH: 1.52 u[IU]/mL (ref 0.450–4.500)

## 2019-07-31 ENCOUNTER — Encounter: Payer: Self-pay | Admitting: Family Medicine

## 2019-08-03 MED ORDER — METHOCARBAMOL 500 MG PO TABS: 500.0000 mg | ORAL_TABLET | Freq: Three times a day (TID) | ORAL | 0 refills | Status: DC

## 2019-09-09 ENCOUNTER — Other Ambulatory Visit: Payer: Self-pay | Admitting: Family Medicine

## 2019-09-09 NOTE — Telephone Encounter (Signed)
Requested me/dication (s) are due for refill today: yes  Requested medication (s) are on the active medication list: yes  Last refill: 08/03/2019  Future visit scheduled: no  Notes to clinic:  muscle relaxants failed  Requested Prescriptions  Pending Prescriptions Disp Refills   methocarbamol (ROBAXIN) 500 MG tablet [Pharmacy Med Name: METHOCARBAMOL 500MG  TABLETS] 90 tablet 0    Sig: TAKE 1 TABLET(500 MG) BY MOUTH THREE TIMES DAILY      Not Delegated - Analgesics:  Muscle Relaxants Failed - 09/09/2019  6:21 PM      Failed - This refill cannot be delegated      Passed - Valid encounter within last 6 months    Recent Outpatient Visits           2 months ago Essential hypertension   Hoffman, Megan P, DO   10 months ago Essential hypertension   [place], [place], DO   1 year ago Essential hypertension   [place], [place], DO   1 year ago [place], [place], [place]   2 years ago Upper respiratory tract infection, unspecified type   [facility], [place], [place]

## 2019-09-10 NOTE — Telephone Encounter (Signed)
Routing to provider  

## 2019-09-11 NOTE — Telephone Encounter (Signed)
Called patient. Left detailed message (DPR reviewed) and asked patient to return call with any question.

## 2019-12-22 ENCOUNTER — Other Ambulatory Visit: Payer: Self-pay | Admitting: Family Medicine

## 2019-12-22 NOTE — Telephone Encounter (Signed)
Requested Prescriptions  Pending Prescriptions Disp Refills  . benazepril (LOTENSIN) 40 MG tablet [Pharmacy Med Name: BENAZEPRIL 40MG  TABLETS] 30 tablet 0    Sig: TAKE 1 TABLET(40 MG) BY MOUTH DAILY     Cardiovascular:  ACE Inhibitors Failed - 12/22/2019  3:24 AM      Failed - Cr in normal range and within 180 days    Creatinine, Ser  Date Value Ref Range Status  06/16/2019 1.13 0.76 - 1.27 mg/dL Final         Failed - K in normal range and within 180 days    Potassium  Date Value Ref Range Status  06/16/2019 4.3 3.5 - 5.2 mmol/L Final         Failed - Valid encounter within last 6 months    Recent Outpatient Visits          6 months ago Essential hypertension   [facility] [place], Megan P, DO   1 year ago Essential hypertension   [facility] [place], [place], DO   1 year ago Essential hypertension   [facility] [place], [place], DO   2 years ago Rash   [facility] [place]   2 years ago Upper respiratory tract infection, unspecified type   [facility] [place]             Passed - Patient is not pregnant      Passed - Last BP in normal range    BP Readings from Last 1 Encounters:  06/16/19 136/84

## 2020-01-19 ENCOUNTER — Other Ambulatory Visit: Payer: Self-pay | Admitting: Family Medicine

## 2020-01-19 NOTE — Telephone Encounter (Signed)
Requested Prescriptions  Pending Prescriptions Disp Refills  . benazepril (LOTENSIN) 40 MG tablet [Pharmacy Med Name: BENAZEPRIL 40MG  TABLETS] 30 tablet 0    Sig: TAKE 1 TABLET(40 MG) BY MOUTH DAILY     Cardiovascular:  ACE Inhibitors Failed - 01/19/2020  8:27 PM      Failed - Cr in normal range and within 180 days    Creatinine, Ser  Date Value Ref Range Status  06/16/2019 1.13 0.76 - 1.27 mg/dL Final         Failed - K in normal range and within 180 days    Potassium  Date Value Ref Range Status  06/16/2019 4.3 3.5 - 5.2 mmol/L Final         Failed - Valid encounter within last 6 months    Recent Outpatient Visits          7 months ago Essential hypertension   [place], Megan P, DO   1 year ago Essential hypertension   [place], [place], DO   1 year ago Essential hypertension   [place], [place], DO   2 years ago [place], [place], [place]   2 years ago Upper respiratory tract infection, unspecified type   [place], [place], [place]             Passed - Patient is not pregnant      Passed - Last BP in normal range    BP Readings from Last 1 Encounters:  06/16/19 136/84         . meloxicam (MOBIC) 15 MG tablet [Pharmacy Med Name: MELOXICAM 15MG  TABLETS] 90 tablet 1    Sig: TAKE 1 TABLET(15 MG) BY MOUTH DAILY     Analgesics:  COX2 Inhibitors Passed - 01/19/2020  8:27 PM      Passed - HGB in normal range and within 360 days    Hemoglobin  Date Value Ref Range Status  06/16/2019 15.4 13.0 - 17.7 g/dL Final         Passed - Cr in normal range and within 360 days    Creatinine, Ser  Date Value Ref Range Status  06/16/2019 1.13 0.76 - 1.27 mg/dL Final         Passed - Patient is not pregnant      Passed - Valid encounter within last 12 months    Recent Outpatient Visits          7 months ago Essential hypertension   [place], [place], DO   1 year ago Essential hypertension   [place], [place], DO   1 year ago Essential hypertension   [place], [place], DO   2 years ago [place], [place], [place]   2 years ago Upper respiratory tract infection, unspecified type   [facility], [place], [place]             . benazepril (LOTENSIN) 40 MG tablet [Pharmacy Med Name: BENAZEPRIL 40MG  TABLETS] 30 tablet 0    Sig: TAKE 1 TABLET(40 MG) BY MOUTH DAILY     Cardiovascular:  ACE Inhibitors Failed - 01/19/2020  8:27 PM      Failed - Cr in normal range and within 180 days    Creatinine, Ser  Date Value Ref Range Status  06/16/2019 1.13 0.76 - 1.27 mg/dL Final  Failed - K in normal range and within 180 days    Potassium  Date Value Ref Range Status  06/16/2019 4.3 3.5 - 5.2 mmol/L Final         Failed - Valid encounter within last 6 months    Recent Outpatient Visits          7 months ago Essential hypertension   [facility] [place], Megan P, DO   1 year ago Essential hypertension   [facility] [place], [place], DO   1 year ago Essential hypertension   [facility] [place], [place], DO   2 years ago Rash   [facility] [place], [place]   2 years ago Upper respiratory tract infection, unspecified type   [facility] [place], [place]             Passed - Patient is not pregnant      Passed - Last BP in normal range    BP Readings from Last 1 Encounters:  06/16/19 136/84

## 2020-01-19 NOTE — Telephone Encounter (Signed)
Requested medications are due for refill today? Yes  Requested medications are on active medication list? Yes  Last Refill: 12/22/2019  # 30 with no refills   Future visit scheduled? No   Notes to Clinic:  Medication failed RX refill protocol due to no encounter in the last 6 moths and no lab work within last 180 days.  Last seen by provider 7 months ago.  Last lab work performed on 06/16/2019.

## 2020-01-20 NOTE — Telephone Encounter (Signed)
appt

## 2020-01-20 NOTE — Telephone Encounter (Signed)
Lvm to make apt.  

## 2020-01-20 NOTE — Telephone Encounter (Signed)
Routing to provider  

## 2020-01-21 NOTE — Telephone Encounter (Signed)
Pt stated he did not want to schedule an apt at this time and would call back to make this apt.Pt verbalized and confirmed understanding he would need apt for refill.

## 2020-02-05 ENCOUNTER — Encounter: Payer: Self-pay | Admitting: Family Medicine

## 2020-02-05 ENCOUNTER — Other Ambulatory Visit: Payer: Self-pay

## 2020-02-05 ENCOUNTER — Ambulatory Visit (INDEPENDENT_AMBULATORY_CARE_PROVIDER_SITE_OTHER): Payer: PRIVATE HEALTH INSURANCE | Admitting: Family Medicine

## 2020-02-05 VITALS — BP 152/84 | HR 51 | Temp 97.9°F | Ht 71.65 in | Wt 190.2 lb

## 2020-02-05 DIAGNOSIS — Z1211 Encounter for screening for malignant neoplasm of colon: Secondary | ICD-10-CM

## 2020-02-05 DIAGNOSIS — I10 Essential (primary) hypertension: Secondary | ICD-10-CM | POA: Diagnosis not present

## 2020-02-05 MED ORDER — OMEPRAZOLE 40 MG PO CPDR: 40.0000 mg | DELAYED_RELEASE_CAPSULE | Freq: Every day | ORAL | 1 refills | Status: DC

## 2020-02-05 MED ORDER — MELOXICAM 15 MG PO TABS: ORAL_TABLET | ORAL | 1 refills | Status: DC

## 2020-02-05 MED ORDER — HYDROCHLOROTHIAZIDE 25 MG PO TABS
25.0000 mg | ORAL_TABLET | Freq: Every day | ORAL | 1 refills | Status: DC
Start: 2020-02-05 — End: 2020-03-15

## 2020-02-05 MED ORDER — BENAZEPRIL HCL 40 MG PO TABS: ORAL_TABLET | ORAL | 1 refills | Status: DC

## 2020-02-05 NOTE — Progress Notes (Signed)
BP (!) 152/84 (BP Location: Left Arm, Cuff Size: Normal)   Pulse (!) 51   Temp 97.9 F (36.6 C) (Oral)   Ht 5' 11.65" (1.82 m)   Wt 190 lb 3.2 oz (86.3 kg)   SpO2 99%   BMI 26.05 kg/m    Subjective:    Patient ID: Brian Gillespie, male    DOB: 1971/01/05, 49 y.o.   MRN: [id number]  HPI: Brian Gillespie is a 49 y.o. male  Chief Complaint  Patient presents with  . Hypertension   HYPERTENSION Hypertension status: uncontrolled  Satisfied with current treatment? yes Duration of hypertension: chronic BP monitoring frequency:  not checking BP medication side effects:  no Medication compliance: excellent compliance Previous BP meds:benazepril Aspirin: no Recurrent headaches: no Visual changes: no Palpitations: no Dyspnea: no Chest pain: no Lower extremity edema: no Dizzy/lightheaded: no  Relevant past medical, surgical, family and social history reviewed and updated as indicated. Interim medical history since our last visit reviewed. Allergies and medications reviewed and updated.  Review of Systems  Constitutional: Negative.   Respiratory: Negative.   Cardiovascular: Negative.   Gastrointestinal: Negative.   Musculoskeletal: Negative.   Neurological: Negative.   Psychiatric/Behavioral: Negative.     Per HPI unless specifically indicated above     Objective:    BP (!) 152/84 (BP Location: Left Arm, Cuff Size: Normal)   Pulse (!) 51   Temp 97.9 F (36.6 C) (Oral)   Ht 5' 11.65" (1.82 m)   Wt 190 lb 3.2 oz (86.3 kg)   SpO2 99%   BMI 26.05 kg/m   Wt Readings from Last 3 Encounters:  02/05/20 190 lb 3.2 oz (86.3 kg)  06/16/19 196 lb (88.9 kg)  10/22/18 203 lb 9 oz (92.3 kg)    Physical Exam Vitals and nursing note reviewed.  Constitutional:      General: He is not in acute distress.    Appearance: Normal appearance. He is not ill-appearing, toxic-appearing or diaphoretic.  HENT:     Head: Normocephalic and atraumatic.     Right Ear: External ear normal.      Left Ear: External ear normal.     Nose: Nose normal.     Mouth/Throat:     Mouth: Mucous membranes are moist.     Pharynx: Oropharynx is clear.  Eyes:     General: No scleral icterus.       Right eye: No discharge.        Left eye: No discharge.     Extraocular Movements: Extraocular movements intact.     Conjunctiva/sclera: Conjunctivae normal.     Pupils: Pupils are equal, round, and reactive to light.  Cardiovascular:     Rate and Rhythm: Normal rate and regular rhythm.     Pulses: Normal pulses.     Heart sounds: Normal heart sounds. No murmur. No friction rub. No gallop.   Pulmonary:     Effort: Pulmonary effort is normal. No respiratory distress.     Breath sounds: Normal breath sounds. No stridor. No wheezing, rhonchi or rales.  Chest:     Chest wall: No tenderness.  Musculoskeletal:        General: Normal range of motion.     Cervical back: Normal range of motion and neck supple.  Skin:    General: Skin is warm and dry.     Capillary Refill: Capillary refill takes less than 2 seconds.     Coloration: Skin is not jaundiced or pale.  Findings: No bruising, erythema, lesion or rash.  Neurological:     General: No focal deficit present.     Mental Status: He is alert and oriented to person, place, and time. Mental status is at baseline.  Psychiatric:        Mood and Affect: Mood normal.        Behavior: Behavior normal.        Thought Content: Thought content normal.        Judgment: Judgment normal.     Results for orders placed or performed in visit on 06/16/19  CBC with Differential/Platelet  Result Value Ref Range   WBC 5.7 3.4 - 10.8 x10E3/uL   RBC 5.11 4.14 - 5.80 x10E6/uL   Hemoglobin 15.4 13.0 - 17.7 g/dL   Hematocrit 41.3 24.4 - 51.0 %   MCV 87 79 - 97 fL   MCH 30.1 26.6 - 33.0 pg   MCHC 34.8 31.5 - 35.7 g/dL   RDW 01.0 27.2 - 53.6 %   Platelets 278 150 - 450 x10E3/uL   Neutrophils 47 Not Estab. %   Lymphs 39 Not Estab. %   Monocytes 11 Not  Estab. %   Eos 2 Not Estab. %   Basos 1 Not Estab. %   Neutrophils Absolute 2.7 1.4 - 7.0 x10E3/uL   Lymphocytes Absolute 2.2 0.7 - 3.1 x10E3/uL   Monocytes Absolute 0.6 0.1 - 0.9 x10E3/uL   EOS (ABSOLUTE) 0.1 0.0 - 0.4 x10E3/uL   Basophils Absolute 0.1 0.0 - 0.2 x10E3/uL   Immature Granulocytes 0 Not Estab. %   Immature Grans (Abs) 0.0 0.0 - 0.1 x10E3/uL  Comprehensive metabolic panel  Result Value Ref Range   Glucose 87 65 - 99 mg/dL   BUN 23 6 - 24 mg/dL   Creatinine, Ser 6.44 0.76 - 1.27 mg/dL   GFR calc non Af Amer 76 >59 mL/min/1.73   GFR calc Af Amer 88 >59 mL/min/1.73   BUN/Creatinine Ratio 20 9 - 20   Sodium 141 134 - 144 mmol/L   Potassium 4.3 3.5 - 5.2 mmol/L   Chloride 102 96 - 106 mmol/L   CO2 24 20 - 29 mmol/L   Calcium 9.7 8.7 - 10.2 mg/dL   Total Protein 7.0 6.0 - 8.5 g/dL   Albumin 4.8 4.0 - 5.0 g/dL   Globulin, Total 2.2 1.5 - 4.5 g/dL   Albumin/Globulin Ratio 2.2 1.2 - 2.2   Bilirubin Total 0.5 0.0 - 1.2 mg/dL   Alkaline Phosphatase 66 39 - 117 IU/L   AST 27 0 - 40 IU/L   ALT 22 0 - 44 IU/L  Lipid Panel w/o Chol/HDL Ratio  Result Value Ref Range   Cholesterol, Total 198 100 - 199 mg/dL   Triglycerides 034 0 - 149 mg/dL   HDL 48 >74 mg/dL   VLDL Cholesterol Cal 26 5 - 40 mg/dL   LDL Chol Calc (NIH) 259 (H) 0 - 99 mg/dL  Microalbumin, Urine Waived  Result Value Ref Range   Microalb, Ur Waived 30 (H) 0 - 19 mg/L   Creatinine, Urine Waived 300 10 - 300 mg/dL   Microalb/Creat Ratio <30 <30 mg/g  TSH  Result Value Ref Range   TSH 1.520 0.450 - 4.500 uIU/mL  UA/M w/rflx Culture, Routine   Specimen: Urine   URINE  Result Value Ref Range   Specific Gravity, UA 1.025 1.005 - 1.030   pH, UA 6.0 5.0 - 7.5   Color, UA Yellow Yellow   Appearance Ur  Clear Clear   Leukocytes,UA Negative Negative   Protein,UA Negative Negative/Trace   Glucose, UA Negative Negative   Ketones, UA Trace (A) Negative   RBC, UA Negative Negative   Bilirubin, UA Negative  Negative   Urobilinogen, Ur 0.2 0.2 - 1.0 mg/dL   Nitrite, UA Negative Negative      Assessment & Plan:   Problem List Items Addressed This Visit      Cardiovascular and Mediastinum   HTN (hypertension) - Primary    Still running a bit high on recheck. Will start him on HCTZ and continue benazepril. Will recheck 1 month. Call with any concerns.  Will check labs at that time.       Relevant Medications   benazepril (LOTENSIN) 40 MG tablet   hydrochlorothiazide (HYDRODIURIL) 25 MG tablet    Other Visit Diagnoses    Screening for colon cancer       Cologuard ordered today.   Relevant Orders   Cologuard       Follow up plan: Return in about 4 weeks (around 03/04/2020).

## 2020-02-05 NOTE — Assessment & Plan Note (Signed)
Still running a bit high on recheck. Will start him on HCTZ and continue benazepril. Will recheck 1 month. Call with any concerns.  Will check labs at that time.

## 2020-02-12 ENCOUNTER — Encounter: Payer: Self-pay | Admitting: Family Medicine

## 2020-02-25 LAB — COLOGUARD: Cologuard: NEGATIVE

## 2020-03-02 LAB — COLOGUARD: COLOGUARD: NEGATIVE

## 2020-03-14 NOTE — Progress Notes (Signed)
BP 132/81 (BP Location: Left Arm, Patient Position: Sitting, Cuff Size: Normal)   Pulse (!) 54   Temp 97.7 F (36.5 C) (Oral)   Wt 187 lb 9.6 oz (85.1 kg)   SpO2 100%   BMI 25.69 kg/m    Subjective:    Patient ID: Brian Gillespie, male    DOB: 1971/03/20, 49 y.o.   MRN: [id number]  HPI: [name] is a 49 y.o. male  Chief Complaint  Patient presents with  . Hypertension   HYPERTENSION Hypertension status: controlled  Satisfied with current treatment? yes Duration of hypertension: chronic BP monitoring frequency:  not checking BP medication side effects:  no Medication compliance: excellent compliance Previous BP meds: benazepril, HCTZ Aspirin: no Recurrent headaches: no Visual changes: no Palpitations: no Dyspnea: no Chest pain: no Lower extremity edema: no Dizzy/lightheaded: no  Leg cramps: yes in the heat   Relevant past medical, surgical, family and social history reviewed and updated as indicated. Interim medical history since our last visit reviewed. Allergies and medications reviewed and updated.  Review of Systems  Constitutional: Negative.   Respiratory: Negative.   Cardiovascular: Negative.   Gastrointestinal: Negative.   Musculoskeletal: Positive for myalgias. Negative for arthralgias, back pain, gait problem, joint swelling, neck pain and neck stiffness.  Skin: Negative.   Psychiatric/Behavioral: Negative.     Per HPI unless specifically indicated above     Objective:    BP 132/81 (BP Location: Left Arm, Patient Position: Sitting, Cuff Size: Normal)   Pulse (!) 54   Temp 97.7 F (36.5 C) (Oral)   Wt 187 lb 9.6 oz (85.1 kg)   SpO2 100%   BMI 25.69 kg/m   Wt Readings from Last 3 Encounters:  03/15/20 187 lb 9.6 oz (85.1 kg)  02/05/20 190 lb 3.2 oz (86.3 kg)  06/16/19 196 lb (88.9 kg)    Physical Exam Vitals and nursing note reviewed.  Constitutional:      General: He is not in acute distress.    Appearance: Normal appearance. He is  not ill-appearing, toxic-appearing or diaphoretic.  HENT:     Head: Normocephalic and atraumatic.     Right Ear: External ear normal.     Left Ear: External ear normal.     Nose: Nose normal.     Mouth/Throat:     Mouth: Mucous membranes are moist.     Pharynx: Oropharynx is clear.  Eyes:     General: No scleral icterus.       Right eye: No discharge.        Left eye: No discharge.     Extraocular Movements: Extraocular movements intact.     Conjunctiva/sclera: Conjunctivae normal.     Pupils: Pupils are equal, round, and reactive to light.  Cardiovascular:     Rate and Rhythm: Normal rate and regular rhythm.     Pulses: Normal pulses.     Heart sounds: Normal heart sounds. No murmur heard.  No friction rub. No gallop.   Pulmonary:     Effort: Pulmonary effort is normal. No respiratory distress.     Breath sounds: Normal breath sounds. No stridor. No wheezing, rhonchi or rales.  Chest:     Chest wall: No tenderness.  Musculoskeletal:        General: Normal range of motion.     Cervical back: Normal range of motion and neck supple.  Skin:    General: Skin is warm and dry.     Capillary Refill: Capillary refill  takes less than 2 seconds.     Coloration: Skin is not jaundiced or pale.     Findings: No bruising, erythema, lesion or rash.  Neurological:     General: No focal deficit present.     Mental Status: He is alert and oriented to person, place, and time. Mental status is at baseline.  Psychiatric:        Mood and Affect: Mood normal.        Behavior: Behavior normal.        Thought Content: Thought content normal.        Judgment: Judgment normal.     Results for orders placed or performed in visit on 03/03/20  Cologuard  Result Value Ref Range   Cologuard Negative Negative      Assessment & Plan:   Problem List Items Addressed This Visit      Cardiovascular and Mediastinum   HTN (hypertension) - Primary    Under good control on current regimen. Continue  current regimen. Continue to monitor. Call with any concerns. Refills given. Call with any concerns. Discussed hydration in th heat with both water and electrolyte replacement.       Relevant Medications   hydrochlorothiazide (MICROZIDE) 12.5 MG capsule   Other Relevant Orders   Basic metabolic panel       Follow up plan: Return in about 5 months (around 08/15/2020) for Physical.

## 2020-03-15 ENCOUNTER — Encounter: Payer: Self-pay | Admitting: Family Medicine

## 2020-03-15 ENCOUNTER — Ambulatory Visit (INDEPENDENT_AMBULATORY_CARE_PROVIDER_SITE_OTHER): Payer: PRIVATE HEALTH INSURANCE | Admitting: Family Medicine

## 2020-03-15 ENCOUNTER — Other Ambulatory Visit: Payer: Self-pay

## 2020-03-15 VITALS — BP 132/81 | HR 54 | Temp 97.7°F | Wt 187.6 lb

## 2020-03-15 DIAGNOSIS — I10 Essential (primary) hypertension: Secondary | ICD-10-CM | POA: Diagnosis not present

## 2020-03-15 MED ORDER — HYDROCHLOROTHIAZIDE 12.5 MG PO CAPS: 12.5000 mg | ORAL_CAPSULE | Freq: Every day | ORAL | 1 refills | Status: DC

## 2020-03-15 NOTE — Assessment & Plan Note (Signed)
Under good control on current regimen. Continue current regimen. Continue to monitor. Call with any concerns. Refills given. Call with any concerns. Discussed hydration in th heat with both water and electrolyte replacement.

## 2020-03-16 LAB — BASIC METABOLIC PANEL
BUN/Creatinine Ratio: 23 — ABNORMAL HIGH (ref 9–20)
BUN: 24 mg/dL (ref 6–24)
CO2: 24 mmol/L (ref 20–29)
Calcium: 9.8 mg/dL (ref 8.7–10.2)
Chloride: 99 mmol/L (ref 96–106)
Creatinine, Ser: 1.06 mg/dL (ref 0.76–1.27)
GFR calc Af Amer: 94 mL/min/{1.73_m2} (ref >59)
GFR calc non Af Amer: 81 mL/min/{1.73_m2} (ref >59)
Glucose: 77 mg/dL (ref 65–99)
Potassium: 4.1 mmol/L (ref 3.5–5.2)
Sodium: 140 mmol/L (ref 134–144)

## 2020-06-12 ENCOUNTER — Other Ambulatory Visit: Payer: Self-pay | Admitting: Family Medicine

## 2020-08-16 ENCOUNTER — Encounter: Payer: PRIVATE HEALTH INSURANCE | Admitting: Family Medicine

## 2020-08-25 ENCOUNTER — Encounter: Payer: Self-pay | Admitting: Family Medicine

## 2020-08-31 MED ORDER — METHOCARBAMOL 500 MG PO TABS: 500.0000 mg | ORAL_TABLET | Freq: Four times a day (QID) | ORAL | 3 refills | Status: DC

## 2020-09-07 ENCOUNTER — Other Ambulatory Visit: Payer: Self-pay | Admitting: Family Medicine

## 2020-10-15 ENCOUNTER — Other Ambulatory Visit: Payer: Self-pay | Admitting: Family Medicine

## 2020-10-15 NOTE — Telephone Encounter (Signed)
Requested Prescriptions  Pending Prescriptions Disp Refills  . meloxicam (MOBIC) 15 MG tablet [Pharmacy Med Name: MELOXICAM 15MG  TABLETS] 90 tablet 0    Sig: TAKE 1 TABLET(15 MG) BY MOUTH DAILY     Analgesics:  COX2 Inhibitors Failed - 10/15/2020 10:01 AM      Failed - HGB in normal range and within 360 days    Hemoglobin  Date Value Ref Range Status  06/16/2019 15.4 13.0 - 17.7 g/dL Final         Passed - Cr in normal range and within 360 days    Creatinine, Ser  Date Value Ref Range Status  03/15/2020 1.06 0.76 - 1.27 mg/dL Final         Passed - Patient is not pregnant      Passed - Valid encounter within last 12 months    Recent Outpatient Visits          7 months ago Essential hypertension   [facility] [place], [place], DO   8 months ago Essential hypertension   [facility] [place], [place], DO   1 year ago Essential hypertension   [facility] [place], [place], DO   1 year ago Essential hypertension   [facility] [place], [place], [place]   2 years ago Essential hypertension   [facility] [place], [place], DO      Future Appointments            In 1 week [place], DO [place]cas [place], [facility]           '

## 2020-10-27 ENCOUNTER — Other Ambulatory Visit: Payer: Self-pay

## 2020-10-27 ENCOUNTER — Encounter: Payer: Self-pay | Admitting: Nurse Practitioner

## 2020-10-27 ENCOUNTER — Ambulatory Visit (INDEPENDENT_AMBULATORY_CARE_PROVIDER_SITE_OTHER): Payer: 59 | Admitting: Nurse Practitioner

## 2020-10-27 VITALS — BP 142/92 | HR 54 | Temp 98.0°F | Wt 202.8 lb

## 2020-10-27 DIAGNOSIS — G8929 Other chronic pain: Secondary | ICD-10-CM | POA: Insufficient documentation

## 2020-10-27 DIAGNOSIS — I10 Essential (primary) hypertension: Secondary | ICD-10-CM | POA: Diagnosis not present

## 2020-10-27 DIAGNOSIS — M545 Low back pain, unspecified: Secondary | ICD-10-CM | POA: Diagnosis not present

## 2020-10-27 DIAGNOSIS — M65341 Trigger finger, right ring finger: Secondary | ICD-10-CM

## 2020-10-27 MED ORDER — HYDROCHLOROTHIAZIDE 25 MG PO TABS: 25.0000 mg | ORAL_TABLET | Freq: Every day | ORAL | 1 refills | Status: DC

## 2020-10-27 MED ORDER — OMEPRAZOLE 40 MG PO CPDR: 40.0000 mg | DELAYED_RELEASE_CAPSULE | Freq: Every day | ORAL | 1 refills | Status: DC

## 2020-10-27 MED ORDER — BENAZEPRIL HCL 40 MG PO TABS: 40.0000 mg | ORAL_TABLET | Freq: Every day | ORAL | 1 refills | Status: DC

## 2020-10-27 MED ORDER — MELOXICAM 15 MG PO TABS
15.0000 mg | ORAL_TABLET | Freq: Every day | ORAL | 1 refills | Status: DC
Start: 2020-10-27 — End: 2020-12-02

## 2020-10-27 NOTE — Patient Instructions (Signed)
Trigger Finger  Trigger finger, also called stenosing tenosynovitis,  is a condition that causes a finger to get stuck in a bent position. Each finger has a tendon, which is a tough, cord-like tissue that connects muscle to bone, and each tendon passes through a tunnel of tissue called a tendon sheath. To move your finger, your tendon needs to glide freely through the sheath. Trigger finger happens when the tendon or the sheath thickens, making it difficult to move your finger. Trigger finger can affect any finger or a thumb. It may affect more than one finger. Mild cases may clear up with rest and medicine. Severe cases require more treatment. What are the causes? Trigger finger is caused by a thickened finger tendon or tendon sheath. The cause of this thickening is not known. What increases the risk? The following factors may make you more likely to develop this condition:  Doing activities that require a strong grip.  Having rheumatoid arthritis, gout, or diabetes.  Being 40-60 years old.  Being male. What are the signs or symptoms? Symptoms of this condition include:  Pain when bending or straightening your finger.  Tenderness or swelling where your finger attaches to the palm of your hand.  A lump in the palm of your hand or on the inside of your finger.  Hearing a noise like a pop or a snap when you try to straighten your finger.  Feeling a catching or locking sensation when you try to straighten your finger.  Being unable to straighten your finger. How is this diagnosed? This condition is diagnosed based on your symptoms and a physical exam. How is this treated? This condition may be treated by:  Resting your finger and avoiding activities that make symptoms worse.  Wearing a finger splint to keep your finger extended.  Taking NSAIDs, such as ibuprofen, to relieve pain and swelling.  Doing gentle exercises to stretch the finger as told by your health care  provider.  Having medicine that reduces swelling and inflammation (steroids) injected into the tendon sheath. Injections may need to be repeated.  Having surgery to open the tendon sheath. This may be done if other treatments do not work and you cannot straighten your finger. You may need physical therapy after surgery. Follow these instructions at home: If you have a splint:  Wear the splint as told by your health care provider. Remove it only as told by your health care provider.  Loosen it if your fingers tingle, become numb, or turn cold and blue.  Keep it clean.  If the splint is not waterproof: ? Do not let it get wet. ? Cover it with a watertight covering when you take a bath or shower. Managing pain, stiffness, and swelling If directed, apply heat to the affected area as often as told by your health care provider. Use the heat source that your health care provider recommends, such as a moist heat pack or a heating pad.  Place a towel between your skin and the heat source.  Leave the heat on for 20-30 minutes.  Remove the heat if your skin turns bright red. This is especially important if you are unable to feel pain, heat, or cold. You may have a greater risk of getting burned. If directed, put ice on the painful area. To do this:  If you have a removable splint, remove it as told by your health care provider.  Put ice in a plastic bag.  Place a towel between your skin   and the bag or between your splint and the bag.  Leave the ice on for 20 minutes, 2-3 times a day.      Activity  Rest your finger as told by your health care provider. Avoid activities that make the pain worse.  Return to your normal activities as told by your health care provider. Ask your health care provider what activities are safe for you.  Do exercises as told by your health care provider.  Ask your health care provider when it is safe to drive if you have a splint on your hand. General  instructions  Take over-the-counter and prescription medicines only as told by your health care provider.  Keep all follow-up visits as told by your health care provider. This is important. Contact a health care provider if:  Your symptoms are not improving with home care. Summary  Trigger finger, also called stenosing tenosynovitis, causes your finger to get stuck in a bent position. This can make it difficult and painful to straighten your finger.  This condition develops when a finger tendon or tendon sheath thickens.  Treatment may include resting your finger, wearing a splint, and taking medicines.  In severe cases, surgery to open the tendon sheath may be needed. This information is not intended to replace advice given to you by your health care provider. Make sure you discuss any questions you have with your health care provider. Document Revised: 01/26/2019 Document Reviewed: 01/26/2019 Elsevier Patient Education  2021 [employer] Inc[employer]  

## 2020-10-27 NOTE — Progress Notes (Signed)
I saw and evaluated the above patient on 10/27/20.  The case was discussed on rounds with Lauren McElwee, DNP. I personally reviewed the HPI, PH, FH, SH, ROS and medications. I repeated pertinent portions of the examination and reviewed the relevant imaging and laboratory data. I agree with the findings, assessment and plan as documented.   

## 2020-10-27 NOTE — Assessment & Plan Note (Signed)
Chronic and stable pain. Continue meloxicam and prilosec for GI protection. Refills sent to pharmacy.

## 2020-10-27 NOTE — Progress Notes (Signed)
Established Patient Office Visit  Subjective:  Patient ID: Brian Gillespie, male    DOB: August 11, 1971  Age: 50 y.o. MRN: [id number]  CC:  Chief Complaint  Patient presents with  . ortho referal    Patient would like referral to be seen for his finger. Was told it is trigger finger and it is becoming painful.   . Pain    Patient here to follow up on tendonitis in arms and back. Recently received refill for meloxicam     HPI Brian Gillespie presents for follow-up on hypertension and refills for all medications.  HYPERTENSION  Hypertension status: slightly elevated, sometimes comes down after sitting for a few minutes  Satisfied with current treatment? yes Duration of hypertension: chronic BP monitoring frequency:  not checking BP range:  n/a BP medication side effects:  no Medication compliance: excellent compliance Previous BP meds: Benazepril, HCTZ Aspirin: no Recurrent headaches: no Visual changes: no Palpitations: no Dyspnea: no Chest pain: no Lower extremity edema: no Dizzy/lightheaded: no  HAND PAIN  Duration: months Involved hand: right Mechanism of injury: unknown Location: Right ring finger Onset: gradual Severity: moderate  Quality: dull pain in palm, painful when finger locks and need to straighten finger out Frequency: intermittent Radiation: no Aggravating factors: unknown   Alleviating factors: Splinting finger at night (not in past week) Treatments attempted: Splint Relief with NSAIDs?: No Weakness: yes Numbness: no Redness: no Swelling:no Bruising: no Fevers: no  Tendonitis Elbows and chronic back pain  Have received cortisone injections in bilateral elbows in the past and seen orthopedics. Currently taking meloxicam daily and robaxin as needed. States pain is stable at this time and he needs refills for his medications.  Past Medical History:  Diagnosis Date  . Esophageal stenosis   . Esophageal tear   . Hypertension     History  reviewed. No pertinent surgical history.  Family History  Problem Relation Age of Onset  . Cancer Paternal Grandmother   . Cancer Paternal Grandfather     Social History   Socioeconomic History  . Marital status: Married    Spouse name: Not on file  . Number of children: Not on file  . Years of education: Not on file  . Highest education level: Not on file  Occupational History  . Not on file  Tobacco Use  . Smoking status: Never Smoker  . Smokeless tobacco: Former Clinical biochemist  . Vaping Use: Never used  Substance and Sexual Activity  . Alcohol use: No  . Drug use: No  . Sexual activity: Yes  Other Topics Concern  . Not on file  Social History Narrative  . Not on file   Social Determinants of Health   Financial Resource Strain: Not on file  Food Insecurity: Not on file  Transportation Needs: Not on file  Physical Activity: Not on file  Stress: Not on file  Social Connections: Not on file  Intimate Partner Violence: Not on file    Outpatient Medications Prior to Visit  Medication Sig Dispense Refill  . benazepril (LOTENSIN) 40 MG tablet TAKE 1 TABLET(40 MG) BY MOUTH DAILY 90 tablet 1  . hydrochlorothiazide (MICROZIDE) 12.5 MG capsule TAKE 1 CAPSULE(12.5 MG) BY MOUTH DAILY 90 capsule 0  . meloxicam (MOBIC) 15 MG tablet TAKE 1 TABLET(15 MG) BY MOUTH DAILY 90 tablet 0  . omeprazole (PRILOSEC) 40 MG capsule Take 1 capsule (40 mg total) by mouth daily. 90 capsule 1  . methocarbamol (ROBAXIN) 500 MG tablet  Take 1 tablet (500 mg total) by mouth 4 (four) times daily. (Patient not taking: Reported on 10/27/2020) 30 tablet 3   No facility-administered medications prior to visit.    Allergies  Allergen Reactions  . Penicillins Rash    ROS Review of Systems  Constitutional: Negative.  Negative for fatigue.  HENT: Negative.   Eyes: Negative.   Respiratory: Negative.   Cardiovascular: Negative.   Gastrointestinal: Negative.   Endocrine: Negative for polydipsia,  polyphagia and polyuria.  Genitourinary: Negative.   Musculoskeletal: Positive for arthralgias (right ring finger).  Neurological: Negative.  Negative for dizziness and light-headedness.  Psychiatric/Behavioral: Negative.      Objective:    Physical Exam Vitals and nursing note reviewed.  Constitutional:      Appearance: Normal appearance.  HENT:     Head: Normocephalic and atraumatic.  Cardiovascular:     Rate and Rhythm: Normal rate and regular rhythm.     Pulses: Normal pulses.     Heart sounds: Normal heart sounds.  Pulmonary:     Effort: Pulmonary effort is normal.     Breath sounds: Normal breath sounds.  Musculoskeletal:        General: No swelling or deformity.     Cervical back: Normal range of motion.     Comments: No swelling or deformity to right hand. He does have his right ring finger get locked and then need to manually pull it up to release  Skin:    General: Skin is warm and dry.  Neurological:     General: No focal deficit present.     Mental Status: He is alert and oriented to person, place, and time.     BP (!) 142/92 (BP Location: Left Arm, Patient Position: Sitting)   Pulse (!) 54   Temp 98 F (36.7 C)   Wt 202 lb 12.8 oz (92 kg)   BMI 27.77 kg/m  Wt Readings from Last 3 Encounters:  10/27/20 202 lb 12.8 oz (92 kg)  03/15/20 187 lb 9.6 oz (85.1 kg)  02/05/20 190 lb 3.2 oz (86.3 kg)     Lab Results  Component Value Date   TSH 1.520 06/16/2019   Lab Results  Component Value Date   WBC 5.7 06/16/2019   HGB 15.4 06/16/2019   HCT 44.2 06/16/2019   MCV 87 06/16/2019   PLT 278 06/16/2019   Lab Results  Component Value Date   NA 140 03/15/2020   K 4.1 03/15/2020   CO2 24 03/15/2020   GLUCOSE 77 03/15/2020   BUN 24 03/15/2020   CREATININE 1.06 03/15/2020   BILITOT 0.5 06/16/2019   ALKPHOS 66 06/16/2019   AST 27 06/16/2019   ALT 22 06/16/2019   PROT 7.0 06/16/2019   ALBUMIN 4.8 06/16/2019   CALCIUM 9.8 03/15/2020   Lab Results   Component Value Date   CHOL 198 06/16/2019   Lab Results  Component Value Date   HDL 48 06/16/2019   Lab Results  Component Value Date   LDLCALC 124 (H) 06/16/2019   Lab Results  Component Value Date   TRIG 148 06/16/2019      Assessment & Plan:   Problem List Items Addressed This Visit      Cardiovascular and Mediastinum   HTN (hypertension) - Primary    BP elevated today at 142/92. Has not been checking his blood pressure at home. Review of past office visits show blood pressure 132/81 on 03/15/20 and 152/84 on 02/05/20. Will increase his HCTZ to 25mg   daily and continue benazepril 40mg  daily. Refills sent to pharmacy. Check BMP today. Follow up in 1 month or sooner with any concerns.       Relevant Medications   benazepril (LOTENSIN) 40 MG tablet   hydrochlorothiazide (HYDRODIURIL) 25 MG tablet   Other Relevant Orders   Basic Metabolic Panel (BMET)     Other   Chronic low back pain    Chronic and stable pain. Continue meloxicam and prilosec for GI protection. Refills sent to pharmacy.      Relevant Medications   meloxicam (MOBIC) 15 MG tablet    Other Visit Diagnoses    Trigger ring finger of right hand       Continue to use splint at night. May use ice or tylenol as needed for pain. Will refer to hand specialist. Follow-up as needed   Relevant Orders   Ambulatory referral to Hand Surgery      Meds ordered this encounter  Medications  . benazepril (LOTENSIN) 40 MG tablet    Sig: Take 1 tablet (40 mg total) by mouth daily.    Dispense:  90 tablet    Refill:  1  . hydrochlorothiazide (HYDRODIURIL) 25 MG tablet    Sig: Take 1 tablet (25 mg total) by mouth daily.    Dispense:  90 tablet    Refill:  1  . meloxicam (MOBIC) 15 MG tablet    Sig: Take 1 tablet (15 mg total) by mouth daily.    Dispense:  90 tablet    Refill:  1  . omeprazole (PRILOSEC) 40 MG capsule    Sig: Take 1 capsule (40 mg total) by mouth daily.    Dispense:  90 capsule    Refill:  1     Follow-up: Return in about 1 month (around 11/24/2020) for physical and bp follow up.    01/24/2021, NP

## 2020-10-27 NOTE — Assessment & Plan Note (Addendum)
BP elevated today at 142/92. Has not been checking his blood pressure at home. Review of past office visits show blood pressure 132/81 on 03/15/20 and 152/84 on 02/05/20. Will increase his HCTZ to 25mg  daily and continue benazepril 40mg  daily. Refills sent to pharmacy. Check BMP today. Follow up in 1 month or sooner with any concerns.

## 2020-10-28 LAB — BASIC METABOLIC PANEL
BUN/Creatinine Ratio: 17 (ref 9–20)
BUN: 19 mg/dL (ref 6–24)
CO2: 25 mmol/L (ref 20–29)
Calcium: 9.9 mg/dL (ref 8.7–10.2)
Chloride: 99 mmol/L (ref 96–106)
Creatinine, Ser: 1.15 mg/dL (ref 0.76–1.27)
GFR calc Af Amer: 85 mL/min/{1.73_m2} (ref >59)
GFR calc non Af Amer: 73 mL/min/{1.73_m2} (ref >59)
Glucose: 70 mg/dL (ref 65–99)
Potassium: 3.8 mmol/L (ref 3.5–5.2)
Sodium: 139 mmol/L (ref 134–144)

## 2020-11-25 ENCOUNTER — Ambulatory Visit: Payer: 59 | Admitting: Family Medicine

## 2020-11-29 ENCOUNTER — Ambulatory Visit: Payer: 59 | Admitting: Family Medicine

## 2020-12-02 ENCOUNTER — Ambulatory Visit (INDEPENDENT_AMBULATORY_CARE_PROVIDER_SITE_OTHER): Payer: 59 | Admitting: Family Medicine

## 2020-12-02 ENCOUNTER — Other Ambulatory Visit: Payer: Self-pay

## 2020-12-02 ENCOUNTER — Encounter: Payer: Self-pay | Admitting: Family Medicine

## 2020-12-02 VITALS — BP 128/79 | HR 54 | Temp 98.0°F | Ht 72.0 in | Wt 201.6 lb

## 2020-12-02 DIAGNOSIS — Z23 Encounter for immunization: Secondary | ICD-10-CM

## 2020-12-02 DIAGNOSIS — I10 Essential (primary) hypertension: Secondary | ICD-10-CM | POA: Diagnosis not present

## 2020-12-02 DIAGNOSIS — Z0000 Encounter for general adult medical examination without abnormal findings: Secondary | ICD-10-CM | POA: Diagnosis not present

## 2020-12-02 LAB — URINALYSIS, ROUTINE W REFLEX MICROSCOPIC
Bilirubin, UA: NEGATIVE
Glucose, UA: NEGATIVE
Ketones, UA: NEGATIVE
Leukocytes,UA: NEGATIVE
Nitrite, UA: NEGATIVE
Protein,UA: NEGATIVE
RBC, UA: NEGATIVE
Specific Gravity, UA: >1.03 — ABNORMAL HIGH (ref 1.005–1.030)
Urobilinogen, Ur: 0.2 mg/dL (ref 0.2–1.0)
pH, UA: 6 (ref 5.0–7.5)

## 2020-12-02 LAB — MICROALBUMIN, URINE WAIVED
Creatinine, Urine Waived: 300 mg/dL (ref 10–300)
Microalb, Ur Waived: 30 mg/L — ABNORMAL HIGH (ref 0–19)
Microalb/Creat Ratio: <30 mg/g (ref <30)

## 2020-12-02 MED ORDER — MELOXICAM 15 MG PO TABS: 15.0000 mg | ORAL_TABLET | Freq: Every day | ORAL | 3 refills | Status: DC

## 2020-12-02 MED ORDER — BENAZEPRIL HCL 40 MG PO TABS: 40.0000 mg | ORAL_TABLET | Freq: Every day | ORAL | 1 refills | Status: DC

## 2020-12-02 MED ORDER — METHOCARBAMOL 500 MG PO TABS: 500.0000 mg | ORAL_TABLET | Freq: Four times a day (QID) | ORAL | 3 refills | Status: DC

## 2020-12-02 MED ORDER — OMEPRAZOLE 40 MG PO CPDR: 40.0000 mg | DELAYED_RELEASE_CAPSULE | Freq: Every day | ORAL | 3 refills | Status: DC

## 2020-12-02 MED ORDER — HYDROCHLOROTHIAZIDE 25 MG PO TABS: 25.0000 mg | ORAL_TABLET | Freq: Every day | ORAL | 1 refills | Status: DC

## 2020-12-02 NOTE — Progress Notes (Signed)
BP 128/79   Pulse (!) 54   Temp 98 F (36.7 C)   Ht 6' (1.829 m)   Wt 201 lb 9.6 oz (91.4 kg)   SpO2 98%   BMI 27.34 kg/m    Subjective:    Patient ID: Brian Gillespie, male    DOB: [birth year], 50 y.o.   MRN: [id number]  HPI: Brian Gillespie is a 50 y.o. male presenting on 12/02/2020 for comprehensive medical examination. Current medical complaints include:  HYPERTENSION Hypertension status: controlled  Satisfied with current treatment? yes Duration of hypertension: chronic BP monitoring frequency:  not checking BP medication side effects:  no Medication compliance: excellent compliance Previous BP meds:benazepril, HCTZ Aspirin: no Recurrent headaches: no Visual changes: no Palpitations: no Dyspnea: no Chest pain: no Lower extremity edema: no Dizzy/lightheaded: no  He currently lives with: wife Interim Problems from his last visit: no  Depression Screen done today and results listed below:  [facility] 12/02/2020 02/05/2020 10/22/2018 09/23/2017 05/21/2017  Decreased Interest 0 0 0 0 0  Down, Depressed, Hopeless 0 0 0 0 0  PHQ - 2 Score 0 0 0 0 0  Altered sleeping - - 0 - 1  Tired, decreased energy - - 0 - 0  Change in appetite - - 0 - 0  Feeling bad or failure about yourself  - - 0 - 0  Trouble concentrating - - 0 - 0  Moving slowly or fidgety/restless - - 0 - 0  Suicidal thoughts - - 0 - 0  PHQ-9 Score - - 0 - 1  Difficult doing work/chores - - Not difficult at all - Not difficult at all    Past Medical History:  Past Medical History:  Diagnosis Date  . Esophageal stenosis   . Esophageal tear   . Hypertension     Surgical History:  History reviewed. No pertinent surgical history.  Medications:  No current outpatient medications on file prior to visit.   No current facility-administered medications on file prior to visit.    Allergies:  Allergies  Allergen Reactions  . Penicillins Rash    Social History:  Social History    Socioeconomic History  . Marital status: Married    Spouse name: Not on file  . Number of children: Not on file  . Years of education: Not on file  . Highest education level: Not on file  Occupational History  . Not on file  Tobacco Use  . Smoking status: Never Smoker  . Smokeless tobacco: Former Clinical biochemistUser  Vaping Use  . Vaping Use: Never used  Substance and Sexual Activity  . Alcohol use: No  . Drug use: No  . Sexual activity: Yes  Other Topics Concern  . Not on file  Social History Narrative  . Not on file   Social Determinants of Health   Financial Resource Strain: Not on file  Food Insecurity: Not on file  Transportation Needs: Not on file  Physical Activity: Not on file  Stress: Not on file  Social Connections: Not on file  Intimate Partner Violence: Not on file   Social History   Tobacco Use  Smoking Status Never Smoker  Smokeless Tobacco Former NeurosurgeonUser   Social History   Substance and Sexual Activity  Alcohol Use No    Family History:  Family History  Problem Relation Age of Onset  . Cancer Paternal Grandmother   . Cancer Paternal Grandfather     Past medical history, surgical history, medications, allergies,  family history and social history reviewed with patient today and changes made to appropriate areas of the chart.   Review of Systems  Constitutional: Negative.   HENT: Negative.   Eyes: Negative.   Respiratory: Negative.   Cardiovascular: Negative.   Gastrointestinal: Negative.   Genitourinary: Negative.   Musculoskeletal: Negative.   Skin: Negative.   Neurological: Negative.   Endo/Heme/Allergies: Negative.   Psychiatric/Behavioral: Negative.    All other ROS negative except what is listed above and in the HPI.      Objective:    BP 128/79   Pulse (!) 54   Temp 98 F (36.7 C)   Ht 6' (1.829 m)   Wt 201 lb 9.6 oz (91.4 kg)   SpO2 98%   BMI 27.34 kg/m   Wt Readings from Last 3 Encounters:  12/02/20 201 lb 9.6 oz (91.4 kg)   10/27/20 202 lb 12.8 oz (92 kg)  03/15/20 187 lb 9.6 oz (85.1 kg)    Physical Exam Vitals and nursing note reviewed.  Constitutional:      General: He is not in acute distress.    Appearance: Normal appearance. He is normal weight. He is not ill-appearing, toxic-appearing or diaphoretic.  HENT:     Head: Normocephalic and atraumatic.     Right Ear: Tympanic membrane, ear canal and external ear normal. There is no impacted cerumen.     Left Ear: Tympanic membrane, ear canal and external ear normal. There is no impacted cerumen.     Nose: Nose normal. No congestion or rhinorrhea.     Mouth/Throat:     Mouth: Mucous membranes are moist.     Pharynx: Oropharynx is clear. No oropharyngeal exudate or posterior oropharyngeal erythema.  Eyes:     General: No scleral icterus.       Right eye: No discharge.        Left eye: No discharge.     Extraocular Movements: Extraocular movements intact.     Conjunctiva/sclera: Conjunctivae normal.     Pupils: Pupils are equal, round, and reactive to light.  Neck:     Vascular: No carotid bruit.  Cardiovascular:     Rate and Rhythm: Normal rate and regular rhythm.     Pulses: Normal pulses.     Heart sounds: No murmur heard. No friction rub. No gallop.   Pulmonary:     Effort: Pulmonary effort is normal. No respiratory distress.     Breath sounds: Normal breath sounds. No stridor. No wheezing, rhonchi or rales.  Chest:     Chest wall: No tenderness.  Abdominal:     General: Abdomen is flat. Bowel sounds are normal. There is no distension.     Palpations: Abdomen is soft. There is no mass.     Tenderness: There is no abdominal tenderness. There is no right CVA tenderness, left CVA tenderness, guarding or rebound.     Hernia: No hernia is present.  Genitourinary:    Comments: Genital exam deferred with shared decision making Musculoskeletal:        General: No swelling, tenderness, deformity or signs of injury.     Cervical back: Normal range  of motion and neck supple. No rigidity. No muscular tenderness.     Right lower leg: No edema.     Left lower leg: No edema.  Lymphadenopathy:     Cervical: No cervical adenopathy.  Skin:    General: Skin is warm and dry.     Capillary Refill: Capillary refill takes less  than 2 seconds.     Coloration: Skin is not jaundiced or pale.     Findings: No bruising, erythema, lesion or rash.  Neurological:     General: No focal deficit present.     Mental Status: He is alert and oriented to person, place, and time.     Cranial Nerves: No cranial nerve deficit.     Sensory: No sensory deficit.     Motor: No weakness.     Coordination: Coordination normal.     Gait: Gait normal.     Deep Tendon Reflexes: Reflexes normal.  Psychiatric:        Mood and Affect: Mood normal.        Behavior: Behavior normal.        Thought Content: Thought content normal.        Judgment: Judgment normal.     Results for orders placed or performed in visit on 10/27/20  Basic Metabolic Panel (BMET)  Result Value Ref Range   Glucose 70 65 - 99 mg/dL   BUN 19 6 - 24 mg/dL   Creatinine, Ser 2.53 0.76 - 1.27 mg/dL   GFR calc non Af Amer 73 >59 mL/min/1.73   GFR calc Af Amer 85 >59 mL/min/1.73   BUN/Creatinine Ratio 17 9 - 20   Sodium 139 134 - 144 mmol/L   Potassium 3.8 3.5 - 5.2 mmol/L   Chloride 99 96 - 106 mmol/L   CO2 25 20 - 29 mmol/L   Calcium 9.9 8.7 - 10.2 mg/dL      Assessment & Plan:   Problem List Items Addressed This Visit      Cardiovascular and Mediastinum   HTN (hypertension)    Under good control on current regimen. Continue current regimen. Continue to monitor. Call with any concerns. Refills given. Labs drawn today.        Relevant Medications   benazepril (LOTENSIN) 40 MG tablet   hydrochlorothiazide (HYDRODIURIL) 25 MG tablet   Other Relevant Orders   Microalbumin, Urine Waived    Other Visit Diagnoses    Routine general medical examination at a health care facility    -   Primary   Vaccines up to date. Screening labs checked today. Cologuard up to date. Continue diet and exercise. Call with any concerns.    Relevant Orders   Comprehensive metabolic panel   CBC with Differential/Platelet   Lipid Panel w/o Chol/HDL Ratio   PSA   TSH   Urinalysis, Routine w reflex microscopic       Discussed aspirin prophylaxis for myocardial infarction prevention and decision was it was not indicated  LABORATORY TESTING:  Health maintenance labs ordered today as discussed above.   The natural history of prostate cancer and ongoing controversy regarding screening and potential treatment outcomes of prostate cancer has been discussed with the patient. The meaning of a false positive PSA and a false negative PSA has been discussed. He indicates understanding of the limitations of this screening test and wishes to proceed with screening PSA testing.   IMMUNIZATIONS:   - Tdap: Tetanus vaccination status reviewed: Td vaccination indicated and given today. - Influenza: Refused - Pneumovax: Not applicable - COVID: Refused  SCREENING: - Colonoscopy: Up to date  Discussed with patient purpose of the colonoscopy is to detect colon cancer at curable precancerous or early stages   PATIENT COUNSELING:    Sexuality: Discussed sexually transmitted diseases, partner selection, use of condoms, avoidance of unintended pregnancy  and contraceptive alternatives.  Advised to avoid cigarette smoking.  I discussed with the patient that most people either abstain from alcohol or drink within safe limits (<=14/week and <=4 drinks/occasion for males, <=7/weeks and <= 3 drinks/occasion for females) and that the risk for alcohol disorders and other health effects rises proportionally with the number of drinks per week and how often a drinker exceeds daily limits.  Discussed cessation/primary prevention of drug use and availability of treatment for abuse.   Diet: Encouraged to adjust caloric  intake to maintain  or achieve ideal body weight, to reduce intake of dietary saturated fat and total fat, to limit sodium intake by avoiding high sodium foods and not adding table salt, and to maintain adequate dietary potassium and calcium preferably from fresh fruits, vegetables, and low-fat dairy products.    stressed the importance of regular exercise  Injury prevention: Discussed safety belts, safety helmets, smoke detector, smoking near bedding or upholstery.   Dental health: Discussed importance of regular tooth brushing, flossing, and dental visits.   Follow up plan: NEXT PREVENTATIVE PHYSICAL DUE IN 1 YEAR. Return in about 6 months (around 06/04/2021).

## 2020-12-02 NOTE — Assessment & Plan Note (Signed)
Under good control on current regimen. Continue current regimen. Continue to monitor. Call with any concerns. Refills given. Labs drawn today.   

## 2020-12-02 NOTE — Patient Instructions (Addendum)
Health Maintenance, Male Adopting a healthy lifestyle and getting preventive care are important in promoting health and wellness. Ask your health care provider about:  The right schedule for you to have regular tests and exams.  Things you can do on your own to prevent diseases and keep yourself healthy. What should I know about diet, weight, and exercise? Eat a healthy diet  Eat a diet that includes plenty of vegetables, fruits, low-fat dairy products, and lean protein.  Do not eat a lot of foods that are high in solid fats, added sugars, or sodium.   Maintain a healthy weight Body mass index (BMI) is a measurement that can be used to identify possible weight problems. It estimates body fat based on height and weight. Your health care provider can help determine your BMI and help you achieve or maintain a healthy weight. Get regular exercise Get regular exercise. This is one of the most important things you can do for your health. Most adults should:  Exercise for at least 150 minutes each week. The exercise should increase your heart rate and make you sweat (moderate-intensity exercise).  Do strengthening exercises at least twice a week. This is in addition to the moderate-intensity exercise.  Spend less time sitting. Even light physical activity can be beneficial. Watch cholesterol and blood lipids Have your blood tested for lipids and cholesterol at 51 years of age, then have this test every 5 years. You may need to have your cholesterol levels checked more often if:  Your lipid or cholesterol levels are high.  You are older than 51 years of age.  You are at high risk for heart disease. What should I know about cancer screening? Many types of cancers can be detected early and may often be prevented. Depending on your health history and family history, you may need to have cancer screening at various ages. This may include screening for:  Colorectal cancer.  Prostate  cancer.  Skin cancer.  Lung cancer. What should I know about heart disease, diabetes, and high blood pressure? Blood pressure and heart disease  High blood pressure causes heart disease and increases the risk of stroke. This is more likely to develop in people who have high blood pressure readings, are of African descent, or are overweight.  Talk with your health care provider about your target blood pressure readings.  Have your blood pressure checked: ? Every 3-5 years if you are 18-39 years of age. ? Every year if you are 40 years old or older.  If you are between the ages of 65 and 75 and are a current or former smoker, ask your health care provider if you should have a one-time screening for abdominal aortic aneurysm (AAA). Diabetes Have regular diabetes screenings. This checks your fasting blood sugar level. Have the screening done:  Once every three years after age 45 if you are at a normal weight and have a low risk for diabetes.  More often and at a younger age if you are overweight or have a high risk for diabetes. What should I know about preventing infection? Hepatitis B If you have a higher risk for hepatitis B, you should be screened for this virus. Talk with your health care provider to find out if you are at risk for hepatitis B infection. Hepatitis C Blood testing is recommended for:  Everyone born from 1945 through 1965.  Anyone with known risk factors for hepatitis C. Sexually transmitted infections (STIs)  You should be screened each   year for STIs, including gonorrhea and chlamydia, if: ? You are sexually active and are younger than 51 years of age. ? You are older than 51 years of age and your health care provider tells you that you are at risk for this type of infection. ? Your sexual activity has changed since you were last screened, and you are at increased risk for chlamydia or gonorrhea. Ask your health care provider if you are at risk.  Ask your  health care provider about whether you are at high risk for HIV. Your health care provider may recommend a prescription medicine to help prevent HIV infection. If you choose to take medicine to prevent HIV, you should first get tested for HIV. You should then be tested every 3 months for as long as you are taking the medicine. Follow these instructions at home: Lifestyle  Do not use any products that contain nicotine or tobacco, such as cigarettes, e-cigarettes, and chewing tobacco. If you need help quitting, ask your health care provider.  Do not use street drugs.  Do not share needles.  Ask your health care provider for help if you need support or information about quitting drugs. Alcohol use  Do not drink alcohol if your health care provider tells you not to drink.  If you drink alcohol: ? Limit how much you have to 0-2 drinks a day. ? Be aware of how much alcohol is in your drink. In the [place] one drink equals one 12 oz bottle of beer (355 mL), one 5 oz glass of wine (148 mL), or one 1 oz glass of hard liquor (44 mL). General instructions  Schedule regular health, dental, and eye exams.  Stay current with your vaccines.  Tell your health care provider if: ? You often feel depressed. ? You have ever been abused or do not feel safe at home. Summary  Adopting a healthy lifestyle and getting preventive care are important in promoting health and wellness.  Follow your health care provider's instructions about healthy diet, exercising, and getting tested or screened for diseases.  Follow your health care provider's instructions on monitoring your cholesterol and blood pressure. This information is not intended to replace advice given to you by your health care provider. Make sure you discuss any questions you have with your health care provider. Document Revised: 09/03/2018 Document Reviewed: 09/03/2018 Elsevier Patient Education  2021 [employer]. Td (Tetanus, Diphtheria)  Vaccine: What You Need to Know 1. Why get vaccinated? Td vaccine can prevent tetanus and diphtheria. Tetanus enters the body through cuts or wounds. Diphtheria spreads from person to person.  TETANUS (T) causes painful stiffening of the muscles. Tetanus can lead to serious health problems, including being unable to open the mouth, having trouble swallowing and breathing, or death.  DIPHTHERIA (D) can lead to difficulty breathing, heart failure, paralysis, or death. 2. Td vaccine Td is only for children 7 years and older, adolescents, and adults.  Td is usually given as a booster dose every 10 years, or after 5 years in the case of a severe or dirty wound or burn. Another vaccine, called "Tdap," may be used instead of Td. Tdap protects against pertussis, also known as "whooping cough," in addition to tetanus and diphtheria. Td may be given at the same time as other vaccines. 3. Talk with your health care provider Tell your vaccination provider if the person getting the vaccine:  Has had an allergic reaction after a previous dose of any vaccine that protects against  tetanus or diphtheria, or has any severe, life-threatening allergies  Has ever had Guillain-Barr Syndrome (also called "GBS")  Has had severe pain or swelling after a previous dose of any vaccine that protects against tetanus or diphtheria In some cases, your health care provider may decide to postpone Td vaccination until a future visit. People with minor illnesses, such as a cold, may be vaccinated. People who are moderately or severely ill should usually wait until they recover before getting Td vaccine.  Your health care provider can give you more information. 4. Risks of a vaccine reaction  Pain, redness, or swelling where the shot was given, mild fever, headache, feeling tired, and nausea, vomiting, diarrhea, or stomachache sometimes happen after Td vaccination. People sometimes faint after medical procedures, including  vaccination. Tell your provider if you feel dizzy or have vision changes or ringing in the ears.  As with any medicine, there is a very remote chance of a vaccine causing a severe allergic reaction, other serious injury, or death. 5. What if there is a serious problem? An allergic reaction could occur after the vaccinated person leaves the clinic. If you see signs of a severe allergic reaction (hives, swelling of the face and throat, difficulty breathing, a fast heartbeat, dizziness, or weakness), call 9-1-1 and get the person to the nearest hospital.  For other signs that concern you, call your health care provider.  Adverse reactions should be reported to the Vaccine Adverse Event Reporting System (VAERS). Your health care provider will usually file this report, or you can do it yourself. Visit the VAERS website at www.vaers.LAgents.no or call (201) 621-2393. VAERS is only for reporting reactions, and VAERS staff members do not give medical advice. 6. The National Vaccine Injury Compensation Program The [employer] Vaccine Injury Compensation Program (VICP) is a federal program that was created to compensate people who may have been injured by certain vaccines. Claims regarding alleged injury or death due to vaccination have a time limit for filing, which may be as short as two years. Visit the VICP website at SpiritualWord.at or call 854-299-4995 to learn about the program and about filing a claim. 7. How can I learn more?  Ask your health care provider.  Call your local or state health department.  Visit the website of the Food and Drug Administration (FDA) for vaccine package inserts and additional information at FinderList.no.  Contact the Centers for Disease Control and Prevention (CDC): ? Call 623-007-5708 (1-800-CDC-INFO) or ? Visit CDC's website at PicCapture.uy. Vaccine Information Statement Td (Tetanus, Diphtheria) Vaccine  (04/29/2020) This information is not intended to replace advice given to you by your health care provider. Make sure you discuss any questions you have with your health care provider. Document Revised: 06/16/2020 Document Reviewed: 06/16/2020 Elsevier Patient Education  2021 [employer].

## 2020-12-03 LAB — COMPREHENSIVE METABOLIC PANEL
ALT: 24 IU/L (ref 0–44)
AST: 25 IU/L (ref 0–40)
Albumin/Globulin Ratio: 1.9 (ref 1.2–2.2)
Albumin: 4.4 g/dL (ref 3.8–4.9)
Alkaline Phosphatase: 50 IU/L (ref 44–121)
BUN/Creatinine Ratio: 17 (ref 9–20)
BUN: 20 mg/dL (ref 6–24)
Bilirubin Total: 0.4 mg/dL (ref 0.0–1.2)
CO2: 23 mmol/L (ref 20–29)
Calcium: 9.6 mg/dL (ref 8.7–10.2)
Chloride: 102 mmol/L (ref 96–106)
Creatinine, Ser: 1.17 mg/dL (ref 0.76–1.27)
Globulin, Total: 2.3 g/dL (ref 1.5–4.5)
Glucose: 93 mg/dL (ref 65–99)
Potassium: 4 mmol/L (ref 3.5–5.2)
Sodium: 139 mmol/L (ref 134–144)
Total Protein: 6.7 g/dL (ref 6.0–8.5)
eGFR: 75 mL/min/{1.73_m2} (ref >59)

## 2020-12-03 LAB — CBC WITH DIFFERENTIAL/PLATELET
Basophils Absolute: 0.1 10*3/uL (ref 0.0–0.2)
Basos: 1 %
EOS (ABSOLUTE): 0.1 10*3/uL (ref 0.0–0.4)
Eos: 1 %
Hematocrit: 41.6 % (ref 37.5–51.0)
Hemoglobin: 14.7 g/dL (ref 13.0–17.7)
Immature Grans (Abs): 0 10*3/uL (ref 0.0–0.1)
Immature Granulocytes: 0 %
Lymphocytes Absolute: 2 10*3/uL (ref 0.7–3.1)
Lymphs: 35 %
MCH: 30.6 pg (ref 26.6–33.0)
MCHC: 35.3 g/dL (ref 31.5–35.7)
MCV: 87 fL (ref 79–97)
Monocytes Absolute: 0.5 10*3/uL (ref 0.1–0.9)
Monocytes: 10 %
Neutrophils Absolute: 3 10*3/uL (ref 1.4–7.0)
Neutrophils: 53 %
Platelets: 293 10*3/uL (ref 150–450)
RBC: 4.8 x10E6/uL (ref 4.14–5.80)
RDW: 13.1 % (ref 11.6–15.4)
WBC: 5.7 10*3/uL (ref 3.4–10.8)

## 2020-12-03 LAB — PSA: Prostate Specific Ag, Serum: 1.6 ng/mL (ref 0.0–4.0)

## 2020-12-03 LAB — LIPID PANEL W/O CHOL/HDL RATIO
Cholesterol, Total: 207 mg/dL — ABNORMAL HIGH (ref 100–199)
HDL: 49 mg/dL (ref >39)
LDL Chol Calc (NIH): 135 mg/dL — ABNORMAL HIGH (ref 0–99)
Triglycerides: 129 mg/dL (ref 0–149)
VLDL Cholesterol Cal: 23 mg/dL (ref 5–40)

## 2020-12-03 LAB — TSH: TSH: 1.06 u[IU]/mL (ref 0.450–4.500)

## 2020-12-10 ENCOUNTER — Other Ambulatory Visit: Payer: Self-pay | Admitting: Family Medicine

## 2021-04-17 ENCOUNTER — Encounter: Payer: Self-pay | Admitting: Family Medicine

## 2021-04-17 DIAGNOSIS — K649 Unspecified hemorrhoids: Secondary | ICD-10-CM

## 2021-04-27 ENCOUNTER — Other Ambulatory Visit: Payer: Self-pay

## 2021-04-27 ENCOUNTER — Encounter: Payer: Self-pay | Admitting: General Surgery

## 2021-04-27 ENCOUNTER — Ambulatory Visit (INDEPENDENT_AMBULATORY_CARE_PROVIDER_SITE_OTHER): Payer: 59 | Admitting: General Surgery

## 2021-04-27 VITALS — BP 140/76 | HR 64 | Temp 98.8°F | Ht 72.0 in | Wt 188.0 lb

## 2021-04-27 DIAGNOSIS — K602 Anal fissure, unspecified: Secondary | ICD-10-CM

## 2021-04-27 NOTE — Patient Instructions (Addendum)
We will have you follow up here in 6 weeks. If you are much better you can cancel this appointment.  You may want to increase your fiber and fluid intake. This will make having a bowel movement easier. You may use fiber powder or fiber gummies.  Your prescription for Nifedipine ointment has been called into [facility] in [place]. This is a compounded medication and they are the only pharmacy that does this. Insurance does not normally cover this medication and the cost is $45.  Apply a small amount to your finger and place this just inside the rectum three times a day for 6 weeks.  The drug store will call you to let you know when this prescription is ready. [facility] is located at [address] [place], [place] [address] [facility]: [phone]   Anal Fissure, Adult  An anal fissure is a small tear or crack in the tissue around the opening of the butt (anus). Bleeding from the tear or crack usually stops on its own within a few minutes. The bleeding may happen every time you poop (have a bowel movement) until the tear or crack heals. What are the causes? This condition is usually caused by passing a large or hard poop (stool). Other causes include: Trouble pooping (constipation). Passing watery poop (diarrhea). Inflammatory bowel disease (Crohn's disease or ulcerative colitis). Childbirth. Infections. Anal sex. What are the signs or symptoms? Symptoms of this condition include: Bleeding from the butt. Small amounts of blood on your poop. The blood coats the outside of the poop. It is not mixed with the poop. Small amounts of blood on the toilet paper or in the toilet after you poop. Pain when passing poop. Itching or irritation around the opening of the butt. How is this diagnosed? This condition may be diagnosed based on a physical exam. Your doctor may: Check your butt. A tear can often be seen by checking the area with care. Check your butt using a short tube (anoscope). The light  in the tube will show any problems in your butt. How is this treated? Treatment for this condition may include: Treating problems that make it hard for you to pass poop. You may be told to: Eat more fiber. Drink more fluid. Take fiber supplements. Take medicines that make poop soft. Taking sitz baths. This may help to heal the tear. Fill the bath tub with about 3-4 inches of very warm water.  Using creams and ointments. If your condition gets worse, other treatments may be needed such as: A shot near the tear or crack (botulinum injection). Surgery to repair the tear or crack. Follow these instructions at home: Eating and drinking  Avoid bananas and dairy products. These foods can make it hard to poop. Drink enough fluid to keep your pee (urine) pale yellow. Eat foods that have a lot of fiber in them, such as: Beans. Whole grains. Fresh fruits. Fresh vegetables.  General instructions  Take over-the-counter and prescription medicines only as told by your doctor. Use creams or ointments only as told by your doctor. Keep the butt area as clean and dry as you can. Take a warm water bath (sitz bath) as told by your doctor. Do not use soap. Keep all follow-up visits as told by your doctor. This is important.  Contact a doctor if: You have more bleeding. You have a fever. You have watery poop that is mixed with blood. You have pain. Your problem gets worse, not better. Summary An anal fissure  is a small tear or crack in the skin around the opening of the butt (anus). This condition is usually caused by passing a large or hard poop (stool). Treatment includes treating the problems that make it hard for you to pass poop. Follow your doctor's instructions about caring for your condition at home. Keep all follow-up visits as told by your doctor. This is important. This information is not intended to replace advice given to you by your health care provider. Make sure you discuss any  questions you have with your healthcare provider. Document Revised: 02/20/2018 Document Reviewed: 02/20/2018 Elsevier Patient Education  2022 [employer].

## 2021-04-27 NOTE — Progress Notes (Signed)
Patient ID: Brian Gillespie, male   DOB: [birth year], 50 y.o.   MRN: [id number]  Chief Complaint  Patient presents with   New Patient (Initial Visit)    HPI Brian Gillespie is a 50 y.o. male.   He is here today for further evaluation of hemorrhoidal disease.  Brian Gillespie states that he has had external hemorrhoids for many years but they have never really given him any trouble.  Within the last 6 months, however, he has been experiencing perirectal/anal pain.  He describes burning and itching, in addition to the pain.  He denies constipation or diarrhea.  He has never had a thrombosed hemorrhoid.  He denies blood in his stool.  He states that occasionally he will notice a little bit of blood on his toilet paper.  He has been using topical Preparation H without relief.  He has not been performing sitz bath's.  He denies any history of inflammatory bowel disease.  He did undergo a colonoscopy when he was in college for irritable bowel syndrome.  More recently, he did Cologuard screening, in 2021.  No family history of colon cancer, no family history of inflammatory bowel disease.   Past Medical History:  Diagnosis Date   Esophageal stenosis    Esophageal tear    Hypertension     History reviewed. No pertinent surgical history.  Family History  Problem Relation Age of Onset   Cancer Paternal Grandmother    Cancer Paternal Grandfather     Social History Social History   Tobacco Use   Smoking status: Never   Smokeless tobacco: Former    Quit date: 09/24/2004  Vaping Use   Vaping Use: Never used  Substance Use Topics   Alcohol use: No   Drug use: No    Allergies  Allergen Reactions   Penicillins Rash    Current Outpatient Medications  Medication Sig Dispense Refill   benazepril (LOTENSIN) 40 MG tablet Take 1 tablet (40 mg total) by mouth daily. 90 tablet 1   hydrochlorothiazide (HYDRODIURIL) 25 MG tablet Take 1 tablet (25 mg total) by mouth daily. 90 tablet 1   meloxicam (MOBIC)  15 MG tablet Take 1 tablet (15 mg total) by mouth daily. 90 tablet 3   methocarbamol (ROBAXIN) 500 MG tablet Take 1 tablet (500 mg total) by mouth 4 (four) times daily. 30 tablet 3   omeprazole (PRILOSEC) 40 MG capsule Take 1 capsule (40 mg total) by mouth daily. 90 capsule 3   No current facility-administered medications for this visit.    Review of Systems Review of Systems  Gastrointestinal:  Positive for rectal pain.  All other systems reviewed and are negative. Discussed in the history of present illness.  Blood pressure 140/76, pulse 64, temperature 98.8 F (37.1 C), height 6' (1.829 m), weight 188 lb (85.3 kg), SpO2 98 %. Body mass index is 25.5 kg/m.  Physical Exam Physical Exam Vitals reviewed. Exam conducted with a chaperone present.  Constitutional:      General: He is not in acute distress.    Appearance: He is normal weight.  HENT:     Head: Normocephalic and atraumatic.     Nose:     Comments: Covered with a mask    Mouth/Throat:     Comments: Covered with a mask Eyes:     General: No scleral icterus.       Right eye: No discharge.        Left eye: No discharge.  Neck:  Comments: No palpable cervical or supraclavicular lymphadenopathy.  The trachea is midline.  No thyromegaly or dominant thyroid masses appreciated.  The gland moves freely with deglutition. Cardiovascular:     Rate and Rhythm: Normal rate and regular rhythm.  Pulmonary:     Effort: Pulmonary effort is normal.     Breath sounds: Normal breath sounds.  Abdominal:     General: Bowel sounds are normal.     Palpations: Abdomen is soft.  Genitourinary:    Rectum: Anal fissure present.       Comments: Sphincter tone is increased.  There is a small skin tag consistent with prior hemorrhoidal disease.  No external hemorrhoids appreciated.  No gross blood on digital rectal exam.  There is an anterior midline anal fissure. Musculoskeletal:        General: No swelling or tenderness.  Skin:     General: Skin is warm and dry.  Neurological:     General: No focal deficit present.     Mental Status: He is alert and oriented to person, place, and time.  Psychiatric:        Mood and Affect: Mood normal.        Behavior: Behavior normal.    Data Reviewed I reviewed the notes from his primary care provider's office.  These are primarily related to complaints not associated with today's visit.  Assessment This is a 50 year old man who presented for evaluation of hemorrhoidal disease.  On physical examination, I did not appreciate any significant hemorrhoidal disease, but there is an anterior anal fissure.  After further discussion with him, he reports that the pain that he experiences is associated with defecation and lasts for some time thereafter.  In addition, my physical exam findings suggest higher than normal sphincter tone.  Plan I have recommended that he perform sitz baths 3-4 times daily and after every bowel movement, as his schedule will permit.  We have also ordered compounded lidocaine/nifedipine for topical application for the next 6 weeks.  Hopefully, this will permit the fissure to heal of its own accord.  If it is not effective, we may pursue other treatment such as Botox chemodenervation of the internal anal sphincter versus lateral internal sphincterotomy.  We have made him a follow-up appointment in 6 weeks' time, but if he has healed, he is welcome to cancel that appointment.    Duanne Guess 04/27/2021, 12:11 PM

## 2021-06-02 ENCOUNTER — Encounter: Payer: Self-pay | Admitting: Family Medicine

## 2021-06-02 ENCOUNTER — Ambulatory Visit (INDEPENDENT_AMBULATORY_CARE_PROVIDER_SITE_OTHER): Payer: 59 | Admitting: Family Medicine

## 2021-06-02 ENCOUNTER — Other Ambulatory Visit: Payer: Self-pay

## 2021-06-02 VITALS — BP 119/65 | HR 52 | Temp 98.1°F | Ht 72.0 in | Wt 192.0 lb

## 2021-06-02 DIAGNOSIS — I10 Essential (primary) hypertension: Secondary | ICD-10-CM | POA: Diagnosis not present

## 2021-06-02 MED ORDER — HYDROCHLOROTHIAZIDE 25 MG PO TABS: 25.0000 mg | ORAL_TABLET | Freq: Every day | ORAL | 1 refills | Status: DC

## 2021-06-02 MED ORDER — BENAZEPRIL HCL 40 MG PO TABS: 40.0000 mg | ORAL_TABLET | Freq: Every day | ORAL | 1 refills | Status: DC

## 2021-06-02 NOTE — Assessment & Plan Note (Signed)
Under good control on current regimen. Continue current regimen. Continue to monitor. Call with any concerns. Refills given. Labs drawn today.   

## 2021-06-02 NOTE — Progress Notes (Signed)
BP 119/65   Pulse (!) 52   Temp 98.1 F (36.7 C) (Oral)   Ht 6' (1.829 m)   Wt 192 lb (87.1 kg)   SpO2 97%   BMI 26.04 kg/m    Subjective:    Patient ID: Brian Gillespie, male    DOB: [birth year], 50 y.o.   MRN: [id number]  HPI: Brian Gillespie is a 50 y.o. male  Chief Complaint  Patient presents with   Hypertension    HYPERTENSION Hypertension status: controlled  Satisfied with current treatment? yes Duration of hypertension: chronic BP monitoring frequency:  not checking BP medication side effects:  no Medication compliance: excellent compliance Previous BP meds:benzepril, HCTZ Aspirin: no Recurrent headaches: no Visual changes: no Palpitations: no Dyspnea: no Chest pain: no Lower extremity edema: no Dizzy/lightheaded: no  Relevant past medical, surgical, family and social history reviewed and updated as indicated. Interim medical history since our last visit reviewed. Allergies and medications reviewed and updated.  Review of Systems  Constitutional: Negative.   Respiratory: Negative.    Cardiovascular: Negative.   Gastrointestinal: Negative.   Musculoskeletal: Negative.   Psychiatric/Behavioral: Negative.     Per HPI unless specifically indicated above     Objective:    BP 119/65   Pulse (!) 52   Temp 98.1 F (36.7 C) (Oral)   Ht 6' (1.829 m)   Wt 192 lb (87.1 kg)   SpO2 97%   BMI 26.04 kg/m   Wt Readings from Last 3 Encounters:  06/02/21 192 lb (87.1 kg)  04/27/21 188 lb (85.3 kg)  12/02/20 201 lb 9.6 oz (91.4 kg)    Physical Exam Vitals and nursing note reviewed.  Constitutional:      General: He is not in acute distress.    Appearance: Normal appearance. He is not ill-appearing, toxic-appearing or diaphoretic.  HENT:     Head: Normocephalic and atraumatic.     Right Ear: External ear normal.     Left Ear: External ear normal.     Nose: Nose normal.     Mouth/Throat:     Mouth: Mucous membranes are moist.     Pharynx: Oropharynx is  clear.  Eyes:     General: No scleral icterus.       Right eye: No discharge.        Left eye: No discharge.     Extraocular Movements: Extraocular movements intact.     Conjunctiva/sclera: Conjunctivae normal.     Pupils: Pupils are equal, round, and reactive to light.  Cardiovascular:     Rate and Rhythm: Normal rate and regular rhythm.     Pulses: Normal pulses.     Heart sounds: Normal heart sounds. No murmur heard.   No friction rub. No gallop.  Pulmonary:     Effort: Pulmonary effort is normal. No respiratory distress.     Breath sounds: Normal breath sounds. No stridor. No wheezing, rhonchi or rales.  Chest:     Chest wall: No tenderness.  Musculoskeletal:        General: Normal range of motion.     Cervical back: Normal range of motion and neck supple.  Skin:    General: Skin is warm and dry.     Capillary Refill: Capillary refill takes less than 2 seconds.     Coloration: Skin is not jaundiced or pale.     Findings: No bruising, erythema, lesion or rash.  Neurological:     General: No focal deficit present.  Mental Status: He is alert and oriented to person, place, and time. Mental status is at baseline.  Psychiatric:        Mood and Affect: Mood normal.        Behavior: Behavior normal.        Thought Content: Thought content normal.        Judgment: Judgment normal.    Results for orders placed or performed in visit on 12/02/20  Comprehensive metabolic panel  Result Value Ref Range   Glucose 93 65 - 99 mg/dL   BUN 20 6 - 24 mg/dL   Creatinine, Ser 1.17 0.76 - 1.27 mg/dL   eGFR 75 >59 mL/min/1.73   BUN/Creatinine Ratio 17 9 - 20   Sodium 139 134 - 144 mmol/L   Potassium 4.0 3.5 - 5.2 mmol/L   Chloride 102 96 - 106 mmol/L   CO2 23 20 - 29 mmol/L   Calcium 9.6 8.7 - 10.2 mg/dL   Total Protein 6.7 6.0 - 8.5 g/dL   Albumin 4.4 3.8 - 4.9 g/dL   Globulin, Total 2.3 1.5 - 4.5 g/dL   Albumin/Globulin Ratio 1.9 1.2 - 2.2   Bilirubin Total 0.4 0.0 - 1.2 mg/dL    Alkaline Phosphatase 50 44 - 121 IU/L   AST 25 0 - 40 IU/L   ALT 24 0 - 44 IU/L  CBC with Differential/Platelet  Result Value Ref Range   WBC 5.7 3.4 - 10.8 x10E3/uL   RBC 4.80 4.14 - 5.80 x10E6/uL   Hemoglobin 14.7 13.0 - 17.7 g/dL   Hematocrit 41.6 37.5 - 51.0 %   MCV 87 79 - 97 fL   MCH 30.6 26.6 - 33.0 pg   MCHC 35.3 31.5 - 35.7 g/dL   RDW 13.1 11.6 - 15.4 %   Platelets 293 150 - 450 x10E3/uL   Neutrophils 53 Not Estab. %   Lymphs 35 Not Estab. %   Monocytes 10 Not Estab. %   Eos 1 Not Estab. %   Basos 1 Not Estab. %   Neutrophils Absolute 3.0 1.4 - 7.0 x10E3/uL   Lymphocytes Absolute 2.0 0.7 - 3.1 x10E3/uL   Monocytes Absolute 0.5 0.1 - 0.9 x10E3/uL   EOS (ABSOLUTE) 0.1 0.0 - 0.4 x10E3/uL   Basophils Absolute 0.1 0.0 - 0.2 x10E3/uL   Immature Granulocytes 0 Not Estab. %   Immature Grans (Abs) 0.0 0.0 - 0.1 x10E3/uL  Lipid Panel w/o Chol/HDL Ratio  Result Value Ref Range   Cholesterol, Total 207 (H) 100 - 199 mg/dL   Triglycerides 129 0 - 149 mg/dL   HDL 49 >39 mg/dL   VLDL Cholesterol Cal 23 5 - 40 mg/dL   LDL Chol Calc (NIH) 135 (H) 0 - 99 mg/dL  PSA  Result Value Ref Range   Prostate Specific Ag, Serum 1.6 0.0 - 4.0 ng/mL  TSH  Result Value Ref Range   TSH 1.060 0.450 - 4.500 uIU/mL  Urinalysis, Routine w reflex microscopic  Result Value Ref Range   Specific Gravity, UA >1.030 (H) 1.005 - 1.030   pH, UA 6.0 5.0 - 7.5   Color, UA Yellow Yellow   Appearance Ur Clear Clear   Leukocytes,UA Negative Negative   Protein,UA Negative Negative/Trace   Glucose, UA Negative Negative   Ketones, UA Negative Negative   RBC, UA Negative Negative   Bilirubin, UA Negative Negative   Urobilinogen, Ur 0.2 0.2 - 1.0 mg/dL   Nitrite, UA Negative Negative  Microalbumin, Urine Waived  Result Value Ref  Range   Microalb, Ur Waived 30 (H) 0 - 19 mg/L   Creatinine, Urine Waived 300 10 - 300 mg/dL   Microalb/Creat Ratio <30 <30 mg/g      Assessment & Plan:   Problem List  Items Addressed This Visit       Cardiovascular and Mediastinum   HTN (hypertension) - Primary    Under good control on current regimen. Continue current regimen. Continue to monitor. Call with any concerns. Refills given. Labs drawn today.       Relevant Medications   benazepril (LOTENSIN) 40 MG tablet   hydrochlorothiazide (HYDRODIURIL) 25 MG tablet   Other Relevant Orders   Basic metabolic panel     Follow up plan: Return in about 6 months (around 11/30/2021), or physical.

## 2021-06-03 LAB — BASIC METABOLIC PANEL
BUN/Creatinine Ratio: 20 (ref 9–20)
BUN: 24 mg/dL (ref 6–24)
CO2: 25 mmol/L (ref 20–29)
Calcium: 9.7 mg/dL (ref 8.7–10.2)
Chloride: 99 mmol/L (ref 96–106)
Creatinine, Ser: 1.19 mg/dL (ref 0.76–1.27)
Glucose: 82 mg/dL (ref 65–99)
Potassium: 4.1 mmol/L (ref 3.5–5.2)
Sodium: 141 mmol/L (ref 134–144)
eGFR: 74 mL/min/{1.73_m2} (ref >59)

## 2021-06-13 ENCOUNTER — Other Ambulatory Visit: Payer: Self-pay

## 2021-06-13 ENCOUNTER — Encounter: Payer: Self-pay | Admitting: General Surgery

## 2021-06-13 ENCOUNTER — Ambulatory Visit (INDEPENDENT_AMBULATORY_CARE_PROVIDER_SITE_OTHER): Payer: 59 | Admitting: General Surgery

## 2021-06-13 ENCOUNTER — Telehealth: Payer: Self-pay | Admitting: General Surgery

## 2021-06-13 VITALS — BP 126/64 | HR 58 | Temp 97.9°F | Ht 72.0 in | Wt 191.0 lb

## 2021-06-13 DIAGNOSIS — K602 Anal fissure, unspecified: Secondary | ICD-10-CM | POA: Diagnosis not present

## 2021-06-13 NOTE — Patient Instructions (Signed)
We have discussed having an exam under sedation with Botox injection to treat your anal fissure.  This will be done at [facility] by Dr Lady Gary.  Our surgery scheduler will call you to look at surgery dates and to go over information. Please see your Blue surgery sheet.  You will need a driver for that day since you will be under anesthesia.

## 2021-06-13 NOTE — Telephone Encounter (Signed)
Left message for patient to call me so that we can go over some surgical dates for scheduling.

## 2021-06-13 NOTE — Progress Notes (Signed)
Patient ID: Brian Gillespie, male   DOB: 07-16-1971, 50 y.o.   MRN: [id number]  Chief Complaint  Patient presents with   Follow-up    HPI Brian Gillespie is a 50 y.o. male.   I first saw him about 6 weeks ago.  My HPI from that visit is copied here:  "He is here today for further evaluation of hemorrhoidal disease.  Brian Gillespie states that he has had external hemorrhoids for many years but they have never really given him any trouble.  Within the last 6 months, however, he has been experiencing perirectal/anal pain.  He describes burning and itching, in addition to the pain.  He denies constipation or diarrhea.  He has never had a thrombosed hemorrhoid.  He denies blood in his stool.  He states that occasionally he will notice a little bit of blood on his toilet paper.  He has been using topical Preparation H without relief.  He has not been performing sitz baths.  He denies any history of inflammatory bowel disease.  He did undergo a colonoscopy when he was in college for irritable bowel syndrome.  More recently, he did Cologuard screening, in 2021.  No family history of colon cancer, no family history of inflammatory bowel disease."  At that visit, physical examination did not demonstrate any significant hemorrhoidal disease, but I did diagnose him with an anterior anal fissure.  He was prescribed compounded topical nifedipine and lidocaine, as well as advised to perform sitz baths.  Today, he states that he has noticed some improvement, but it is still painful.  He notices more pain when wiping after a bowel movement.  No significant blood.  He does report that his stools are soft and easy to pass.  The sitz baths did not seem to help much.   Past Medical History:  Diagnosis Date   Esophageal stenosis    Esophageal tear    Hypertension     No past surgical history on file.  Family History  Problem Relation Age of Onset   Cancer Paternal Grandmother    Cancer Paternal Grandfather      Social History Social History   Tobacco Use   Smoking status: Never   Smokeless tobacco: Former    Quit date: 09/24/2004  Vaping Use   Vaping Use: Never used  Substance Use Topics   Alcohol use: No   Drug use: No    Allergies  Allergen Reactions   Penicillins Rash    Current Outpatient Medications  Medication Sig Dispense Refill   benazepril (LOTENSIN) 40 MG tablet Take 1 tablet (40 mg total) by mouth daily. 90 tablet 1   hydrochlorothiazide (HYDRODIURIL) 25 MG tablet Take 1 tablet (25 mg total) by mouth daily. 90 tablet 1   meloxicam (MOBIC) 15 MG tablet Take 1 tablet (15 mg total) by mouth daily. 90 tablet 3   methocarbamol (ROBAXIN) 500 MG tablet Take 1 tablet (500 mg total) by mouth 4 (four) times daily. 30 tablet 3   omeprazole (PRILOSEC) 40 MG capsule Take 1 capsule (40 mg total) by mouth daily. 90 capsule 3   No current facility-administered medications for this visit.    Review of Systems Review of Systems  Gastrointestinal:  Positive for rectal pain.  All other systems reviewed and are negative.  Blood pressure 126/64, pulse (!) 58, temperature 97.9 F (36.6 C), height 6' (1.829 m), weight 191 lb (86.6 kg), SpO2 95 %.  Physical Exam Physical Exam Vitals reviewed. Exam conducted with  a chaperone present.  Constitutional:      General: He is not in acute distress.    Appearance: He is normal weight.  HENT:     Head: Normocephalic and atraumatic.     Nose:     Comments: Covered with a mask    Mouth/Throat:     Comments: Covered with a mask Eyes:     General: No scleral icterus.       Right eye: No discharge.        Left eye: No discharge.  Cardiovascular:     Rate and Rhythm: Regular rhythm. Bradycardia present.     Pulses: Normal pulses.  Pulmonary:     Effort: Pulmonary effort is normal. No respiratory distress.  Abdominal:     General: Abdomen is flat.  Genitourinary:    Rectum: Anal fissure present. Abnormal anal tone.       Comments:  Anterior anal fissure present.  Skin tag consistent with prior hemorrhoidal disease.  Increased anal sphincter tone, as per prior examination. Musculoskeletal:        General: No swelling or deformity.  Skin:    General: Skin is warm and dry.  Neurological:     General: No focal deficit present.     Mental Status: He is alert and oriented to person, place, and time.    Data Reviewed No relevant recent data to review.  Assessment This is a 50 year old man with an anal fissure that is failed to respond to conservative management with topical nifedipine/lidocaine.  He continues to have pain with defecation and with wiping.  Plan I have offered him chemodenervation of the internal anal sphincter using Botox injection.  I discussed the risks with him, which include temporary incontinence to stool and/or gas, failure to resolve the fissure, allergic reaction to Botox, need for additional interventions or procedures.  I did let him know that if this were unsuccessful, he would likely need to undergo lateral internal sphincterotomy, which I do not perform.  We will work on getting him scheduled for the Botox injection.    Duanne Guess 06/13/2021, 9:19 AM

## 2021-06-14 ENCOUNTER — Telehealth: Payer: Self-pay | Admitting: General Surgery

## 2021-06-14 NOTE — Telephone Encounter (Signed)
Patient has been informed of Pre-Admission date/time, COVID Testing date and Surgery date.  Surgery Date: 06/21/21 Preadmission Testing Date: 06/16/21 (phone 1p-5p) Covid Testing Date: Not needed.     Patient has been advised to call at [phone], between 1-3:00pm the day before surgery, to find out what time to arrive for surgery.

## 2021-06-16 ENCOUNTER — Inpatient Hospital Stay
Admission: RE | Admit: 2021-06-16 | Discharge: 2021-06-16 | Disposition: A | Discharge status: Home / Self Care | Payer: Self-pay | Source: Ambulatory Visit

## 2021-06-16 NOTE — Pre-Procedure Instructions (Signed)
this Clinical research associate informed me that he will not be doing his surgery scheduled for 06/21/21 due to cost. I referred to him the hospital finance dept but he declined. I informed him that he needed to make you aware asap, I was calling him to do his pre-admission phone call. Secure chat message sent to Dr. Lady Gary and Toy Cookey.

## 2021-06-19 ENCOUNTER — Telehealth: Payer: Self-pay | Admitting: General Surgery

## 2021-06-19 NOTE — Telephone Encounter (Signed)
On Friday 06/16/21 patient calls and wants to cancel his surgery for 06/21/21.  Patient states that due to financial reasons, will not be rescheduling at this time.  He will call if decides to proceed in the future.

## 2021-06-21 ENCOUNTER — Encounter: Admission: RE | Payer: Self-pay | Source: Ambulatory Visit

## 2021-06-21 ENCOUNTER — Ambulatory Visit: Admission: RE | Admit: 2021-06-21 | Payer: 59 | Source: Ambulatory Visit | Admitting: General Surgery

## 2021-06-21 SURGERY — HEMORRHOIDECTOMY
Anesthesia: General

## 2021-06-29 ENCOUNTER — Encounter: Payer: Self-pay | Admitting: General Surgery

## 2021-09-14 ENCOUNTER — Other Ambulatory Visit: Payer: Self-pay | Admitting: Nurse Practitioner

## 2021-09-14 NOTE — Telephone Encounter (Signed)
Requested Prescriptions  Pending Prescriptions Disp Refills   omeprazole (PRILOSEC) 40 MG capsule [Pharmacy Med Name: OMEPRAZOLE 40MG  CAPSULES] 90 capsule 3    Sig: TAKE 1 CAPSULE(40 MG) BY MOUTH DAILY     Gastroenterology: Proton Pump Inhibitors Passed - 09/14/2021  5:53 PM      Passed - Valid encounter within last 12 months    Recent Outpatient Visits          3 months ago Primary hypertension   [facility] [place], Megan P, DO   9 months ago Routine general medical examination at a health care facility   [facility], Megan P, DO   10 months ago Primary hypertension   [facility] [place], [facility], NP   1 year ago Essential hypertension   [facility] [place], [place], DO   1 year ago Essential hypertension   [facility] [place], [place], DO      Future Appointments            In 2 months [place], Laural Benes, DO [facility] Rud, [facility]

## 2021-12-04 ENCOUNTER — Encounter: Payer: 59 | Admitting: Family Medicine

## 2021-12-10 ENCOUNTER — Other Ambulatory Visit: Payer: Self-pay | Admitting: Family Medicine

## 2021-12-12 NOTE — Telephone Encounter (Signed)
Requested medication (s) are due for refill today: Yes ? ?Requested medication (s) are on the active medication list: Yes ? ?Last refill:  12/02/20 ? ?Future visit scheduled: Yes ? ?Notes to clinic:  Prescription expired. ? ? ? ?Requested Prescriptions  ?Pending Prescriptions Disp Refills  ? omeprazole (PRILOSEC) 40 MG capsule [Pharmacy Med Name: OMEPRAZOLE 40MG  CAPSULES] 90 capsule 3  ?  Sig: TAKE 1 CAPSULE(40 MG) BY MOUTH DAILY  ?  ? Gastroenterology: Proton Pump Inhibitors Passed - 12/10/2021  5:19 PM  ?  ?  Passed - Valid encounter within last 12 months  ?  Recent Outpatient Visits   ? ?      ? 6 months ago Primary hypertension  ? [facility] [place], [place] P, DO  ? 1 year ago Routine general medical examination at a health care facility  ? [facility] [place], Megan P, DO  ? 1 year ago Primary hypertension  ? [facility] [place], Lauren A, NP  ? 1 year ago Essential hypertension  ? [facility] [place], Megan P, DO  ? 1 year ago Essential hypertension  ? [facility] [place], [place] P, DO  ? ?  ?  ?Future Appointments   ? ?        ? In 1 week [place], Laural Benes, DO [facility], [facility]  ? ?  ? ?  ?  ?  ? ?

## 2021-12-12 NOTE — Telephone Encounter (Signed)
Not enough medication to get to upcoming appointment.  ?

## 2021-12-15 ENCOUNTER — Encounter: Payer: Self-pay | Admitting: Family Medicine

## 2021-12-20 ENCOUNTER — Other Ambulatory Visit: Payer: Self-pay

## 2021-12-20 ENCOUNTER — Encounter: Payer: Self-pay | Admitting: Family Medicine

## 2021-12-20 ENCOUNTER — Ambulatory Visit: Payer: Managed Care, Other (non HMO) | Admitting: Family Medicine

## 2021-12-20 VITALS — BP 134/69 | HR 60 | Temp 98.4°F | Ht 73.0 in | Wt 200.8 lb

## 2021-12-20 DIAGNOSIS — Z0000 Encounter for general adult medical examination without abnormal findings: Secondary | ICD-10-CM

## 2021-12-20 DIAGNOSIS — I10 Essential (primary) hypertension: Secondary | ICD-10-CM | POA: Diagnosis not present

## 2021-12-20 LAB — URINALYSIS, ROUTINE W REFLEX MICROSCOPIC
Bilirubin, UA: NEGATIVE
Glucose, UA: NEGATIVE
Ketones, UA: NEGATIVE
Leukocytes,UA: NEGATIVE
Nitrite, UA: NEGATIVE
Protein,UA: NEGATIVE
RBC, UA: NEGATIVE
Specific Gravity, UA: 1.025 (ref 1.005–1.030)
Urobilinogen, Ur: 0.2 mg/dL (ref 0.2–1.0)
pH, UA: 6.5 (ref 5.0–7.5)

## 2021-12-20 LAB — MICROALBUMIN, URINE WAIVED
Creatinine, Urine Waived: 200 mg/dL (ref 10–300)
Microalb, Ur Waived: 10 mg/L (ref 0–19)
Microalb/Creat Ratio: <30 mg/g (ref <30)

## 2021-12-20 MED ORDER — OMEPRAZOLE 40 MG PO CPDR: DELAYED_RELEASE_CAPSULE | ORAL | 3 refills | Status: DC

## 2021-12-20 MED ORDER — HYDROCHLOROTHIAZIDE 25 MG PO TABS: 25.0000 mg | ORAL_TABLET | Freq: Every day | ORAL | 1 refills | Status: DC

## 2021-12-20 MED ORDER — MELOXICAM 15 MG PO TABS: 15.0000 mg | ORAL_TABLET | Freq: Every day | ORAL | 3 refills | Status: DC

## 2021-12-20 MED ORDER — BENAZEPRIL HCL 40 MG PO TABS
40.0000 mg | ORAL_TABLET | Freq: Every day | ORAL | 1 refills | Status: DC
Start: 2021-12-20 — End: 2022-06-25

## 2021-12-20 NOTE — Progress Notes (Signed)
? ?BP 134/69   Pulse 60   Temp 98.4 ?F (36.9 ?C) (Oral)   Ht $R'6\' 1"'bo$  (1.854 m)   Wt 200 lb 12.8 oz (91.1 kg)   SpO2 98%   BMI 26.49 kg/m?   ? ?Subjective:  ? ? Patient ID: Brian Gillespie, male    DOB: May 21, 1971, 51 y.o.   MRN: [id number] ? ?HPI: ?Brian Gillespie is a 51 y.o. male presenting on 12/20/2021 for comprehensive medical examination. Current medical complaints include: ? ?HYPERTENSION ?Hypertension status: controlled  ?Satisfied with current treatment? yes ?Duration of hypertension: chronic ?BP monitoring frequency:  not checking ?BP medication side effects:  no ?Medication compliance: excellent compliance ?Previous BP meds: benazepril, HCTZ ?Aspirin: no ?Recurrent headaches: no ?Visual changes: no ?Palpitations: no ?Dyspnea: no ?Chest pain: no ?Lower extremity edema: no ?Dizzy/lightheaded: no ? ?He currently lives with: wife and kids ?Interim Problems from his last visit: no ? ?Depression Screen done today and results listed below:  ? ?  12/20/2021  ? 10:24 AM 12/02/2020  ?  8:59 AM 02/05/2020  ?  3:02 PM 10/22/2018  ?  9:20 AM 09/23/2017  ? 10:04 AM  ?Depression screen PHQ 2/9  ?Decreased Interest 0 0 0 0 0  ?Down, Depressed, Hopeless 0 0 0 0 0  ?PHQ - 2 Score 0 0 0 0 0  ?Altered sleeping 0   0   ?Tired, decreased energy 0   0   ?Change in appetite 0   0   ?Feeling bad or failure about yourself  0   0   ?Trouble concentrating 0   0   ?Moving slowly or fidgety/restless 0   0   ?Suicidal thoughts 0   0   ?PHQ-9 Score 0   0   ?Difficult doing work/chores Not difficult at all   Not difficult at all   ? ? ? ?Past Medical History:  ?Past Medical History:  ?Diagnosis Date  ? Esophageal stenosis   ? Esophageal tear   ? Hypertension   ? ? ?Surgical History:  ?History reviewed. No pertinent surgical history. ? ?Medications:  ?Current Outpatient Medications on File Prior to Visit  ?Medication Sig  ? Misc Natural Products (GLUCOSAMINE CHOND COMPLEX/MSM PO) Take 2 each by mouth daily.  ? methocarbamol (ROBAXIN) 500 MG  tablet Take 1 tablet (500 mg total) by mouth 4 (four) times daily. (Patient not taking: Reported on 12/20/2021)  ? Misc Natural Products (JOINT HEALTH PO) Take 2 each by mouth daily. (Patient not taking: Reported on 12/20/2021)  ? ?No current facility-administered medications on file prior to visit.  ? ? ?Allergies:  ?Allergies  ?Allergen Reactions  ? Penicillins Rash  ?  Reaction: Childhood  ? ? ?Social History:  ?Social History  ? ?Socioeconomic History  ? Marital status: Married  ?  Spouse name: Not on file  ? Number of children: Not on file  ? Years of education: Not on file  ? Highest education level: Not on file  ?Occupational History  ? Not on file  ?Tobacco Use  ? Smoking status: Never  ? Smokeless tobacco: Former  ?  Quit date: 09/24/2004  ?Vaping Use  ? Vaping Use: Never used  ?Substance and Sexual Activity  ? Alcohol use: No  ? Drug use: No  ? Sexual activity: Yes  ?Other Topics Concern  ? Not on file  ?Social History Narrative  ? Not on file  ? ?Social Determinants of Health  ? ?Financial Resource Strain: Not on file  ?  Food Insecurity: Not on file  ?Transportation Needs: Not on file  ?Physical Activity: Not on file  ?Stress: Not on file  ?Social Connections: Not on file  ?Intimate Partner Violence: Not on file  ? ?Social History  ? ?Tobacco Use  ?Smoking Status Never  ?Smokeless Tobacco Former  ? Quit date: 09/24/2004  ? ?Social History  ? ?Substance and Sexual Activity  ?Alcohol Use No  ? ? ?Family History:  ?Family History  ?Problem Relation Age of Onset  ? Cancer Paternal Grandmother   ? Cancer Paternal Grandfather   ? ? ?Past medical history, surgical history, medications, allergies, family history and social history reviewed with patient today and changes made to appropriate areas of the chart.  ? ?Review of Systems  ?Constitutional: Negative.   ?HENT: Negative.    ?Eyes: Negative.   ?Respiratory: Negative.    ?Cardiovascular: Negative.   ?Gastrointestinal: Negative.   ?Genitourinary: Negative.    ?Musculoskeletal: Negative.   ?Skin: Negative.   ?Neurological: Negative.   ?Endo/Heme/Allergies: Negative.   ?Psychiatric/Behavioral: Negative.    ?All other ROS negative except what is listed above and in the HPI.  ? ?   ?Objective:  ?  ?BP 134/69   Pulse 60   Temp 98.4 ?F (36.9 ?C) (Oral)   Ht $R'6\' 1"'Fw$  (1.854 m)   Wt 200 lb 12.8 oz (91.1 kg)   SpO2 98%   BMI 26.49 kg/m?   ?Wt Readings from Last 3 Encounters:  ?12/20/21 200 lb 12.8 oz (91.1 kg)  ?06/13/21 191 lb (86.6 kg)  ?06/02/21 192 lb (87.1 kg)  ?  ?Physical Exam ? ?Results for orders placed or performed in visit on 06/02/21  ?Basic metabolic panel  ?Result Value Ref Range  ? Glucose 82 65 - 99 mg/dL  ? BUN 24 6 - 24 mg/dL  ? Creatinine, Ser 1.19 0.76 - 1.27 mg/dL  ? eGFR 74 >59 mL/min/1.73  ? BUN/Creatinine Ratio 20 9 - 20  ? Sodium 141 134 - 144 mmol/L  ? Potassium 4.1 3.5 - 5.2 mmol/L  ? Chloride 99 96 - 106 mmol/L  ? CO2 25 20 - 29 mmol/L  ? Calcium 9.7 8.7 - 10.2 mg/dL  ? ?   ?Assessment & Plan:  ? ?Problem List Items Addressed This Visit   ? ?  ? Cardiovascular and Mediastinum  ? HTN (hypertension)  ?  Under good control on current regimen. Continue current regimen. Continue to monitor. Call with any concerns. Refills given. Labs drawn today. ? ?  ?  ? Relevant Medications  ? benazepril (LOTENSIN) 40 MG tablet  ? hydrochlorothiazide (HYDRODIURIL) 25 MG tablet  ? Other Relevant Orders  ? Microalbumin, Urine Waived  ? ?Other Visit Diagnoses   ? ? Routine general medical examination at a health care facility    -  Primary  ? Vacccines up to date/declined- screening labs checked today. Cologuard up to date. Continue diet and exercise. Call with any concerns.   ? Relevant Orders  ? Comprehensive metabolic panel  ? CBC with Differential/Platelet  ? Lipid Panel w/o Chol/HDL Ratio  ? PSA  ? TSH  ? Urinalysis, Routine w reflex microscopic  ? Microalbumin, Urine Waived  ? HIV Antibody (routine testing w rflx)  ? Hepatitis C Antibody  ? ?  ?  ? ?LABORATORY  TESTING:  ?Health maintenance labs ordered today as discussed above.  ? ?The natural history of prostate cancer and ongoing controversy regarding screening and potential treatment outcomes of prostate cancer has been  discussed with the patient. The meaning of a false positive PSA and a false negative PSA has been discussed. He indicates understanding of the limitations of this screening test and wishes to proceed with screening PSA testing. ? ? ?IMMUNIZATIONS:   ?- Tdap: Tetanus vaccination status reviewed: last tetanus booster within 10 years. ?- Influenza: Refused ?- Pneumovax: Not applicable ?- Prevnar: Not applicable ?- COVID: Refused ?- HPV: Not applicable ?- Shingrix vaccine: Administered today ? ?SCREENING: ?- Colonoscopy: Up to date  ?Discussed with patient purpose of the colonoscopy is to detect colon cancer at curable precancerous or early stages  ? ?PATIENT COUNSELING:   ? ?Sexuality: Discussed sexually transmitted diseases, partner selection, use of condoms, avoidance of unintended pregnancy  and contraceptive alternatives.  ? ?Advised to avoid cigarette smoking. ? ?I discussed with the patient that most people either abstain from alcohol or drink within safe limits (<=14/week and <=4 drinks/occasion for males, <=7/weeks and <= 3 drinks/occasion for females) and that the risk for alcohol disorders and other health effects rises proportionally with the number of drinks per week and how often a drinker exceeds daily limits. ? ?Discussed cessation/primary prevention of drug use and availability of treatment for abuse.  ? ?Diet: Encouraged to adjust caloric intake to maintain  or achieve ideal body weight, to reduce intake of dietary saturated fat and total fat, to limit sodium intake by avoiding high sodium foods and not adding table salt, and to maintain adequate dietary potassium and calcium preferably from fresh fruits, vegetables, and low-fat dairy products.   ? ?stressed the importance of regular  exercise ? ?Injury prevention: Discussed safety belts, safety helmets, smoke detector, smoking near bedding or upholstery.  ? ?Dental health: Discussed importance of regular tooth brushing, flossing, and dental visits.  ? ?Follow up

## 2021-12-20 NOTE — Assessment & Plan Note (Signed)
Under good control on current regimen. Continue current regimen. Continue to monitor. Call with any concerns. Refills given. Labs drawn today.   

## 2021-12-21 LAB — TSH: TSH: 1.3 u[IU]/mL (ref 0.450–4.500)

## 2021-12-21 LAB — CBC WITH DIFFERENTIAL/PLATELET
Basophils Absolute: 0.1 10*3/uL (ref 0.0–0.2)
Basos: 1 %
EOS (ABSOLUTE): 0.1 10*3/uL (ref 0.0–0.4)
Eos: 2 %
Hematocrit: 42.6 % (ref 37.5–51.0)
Hemoglobin: 15 g/dL (ref 13.0–17.7)
Immature Grans (Abs): 0 10*3/uL (ref 0.0–0.1)
Immature Granulocytes: 0 %
Lymphocytes Absolute: 1.9 10*3/uL (ref 0.7–3.1)
Lymphs: 35 %
MCH: 30.3 pg (ref 26.6–33.0)
MCHC: 35.2 g/dL (ref 31.5–35.7)
MCV: 86 fL (ref 79–97)
Monocytes Absolute: 0.5 10*3/uL (ref 0.1–0.9)
Monocytes: 9 %
Neutrophils Absolute: 2.8 10*3/uL (ref 1.4–7.0)
Neutrophils: 53 %
Platelets: 278 10*3/uL (ref 150–450)
RBC: 4.95 x10E6/uL (ref 4.14–5.80)
RDW: 12.8 % (ref 11.6–15.4)
WBC: 5.3 10*3/uL (ref 3.4–10.8)

## 2021-12-21 LAB — LIPID PANEL W/O CHOL/HDL RATIO
Cholesterol, Total: 220 mg/dL — ABNORMAL HIGH (ref 100–199)
HDL: 45 mg/dL
LDL Chol Calc (NIH): 147 mg/dL — ABNORMAL HIGH (ref 0–99)
Triglycerides: 157 mg/dL — ABNORMAL HIGH (ref 0–149)
VLDL Cholesterol Cal: 28 mg/dL (ref 5–40)

## 2021-12-21 LAB — COMPREHENSIVE METABOLIC PANEL WITH GFR
ALT: 25 [IU]/L (ref 0–44)
AST: 23 [IU]/L (ref 0–40)
Albumin/Globulin Ratio: 2 (ref 1.2–2.2)
Albumin: 4.7 g/dL (ref 3.8–4.9)
Alkaline Phosphatase: 47 [IU]/L (ref 44–121)
BUN/Creatinine Ratio: 21 — ABNORMAL HIGH (ref 9–20)
BUN: 27 mg/dL — ABNORMAL HIGH (ref 6–24)
Bilirubin Total: 0.5 mg/dL (ref 0.0–1.2)
CO2: 24 mmol/L (ref 20–29)
Calcium: 9.9 mg/dL (ref 8.7–10.2)
Chloride: 102 mmol/L (ref 96–106)
Creatinine, Ser: 1.26 mg/dL (ref 0.76–1.27)
Globulin, Total: 2.3 g/dL (ref 1.5–4.5)
Glucose: 85 mg/dL (ref 70–99)
Potassium: 4.3 mmol/L (ref 3.5–5.2)
Sodium: 141 mmol/L (ref 134–144)
Total Protein: 7 g/dL (ref 6.0–8.5)
eGFR: 69 mL/min/{1.73_m2}

## 2021-12-21 LAB — HEPATITIS C ANTIBODY: Hep C Virus Ab: NONREACTIVE

## 2021-12-21 LAB — PSA: Prostate Specific Ag, Serum: 1.7 ng/mL (ref 0.0–4.0)

## 2021-12-21 LAB — HIV ANTIBODY (ROUTINE TESTING W REFLEX): HIV Screen 4th Generation wRfx: NONREACTIVE

## 2022-01-01 ENCOUNTER — Encounter: Payer: Self-pay | Admitting: Family Medicine

## 2022-01-02 ENCOUNTER — Emergency Department (HOSPITAL_COMMUNITY): Payer: Commercial Managed Care - HMO

## 2022-01-02 ENCOUNTER — Emergency Department (HOSPITAL_COMMUNITY)
Admission: EM | Admit: 2022-01-02 | Discharge: 2022-01-02 | Disposition: A | Discharge status: Home / Self Care | Payer: Commercial Managed Care - HMO | Attending: Student | Admitting: Student

## 2022-01-02 ENCOUNTER — Encounter (HOSPITAL_COMMUNITY): Payer: Self-pay | Admitting: Emergency Medicine

## 2022-01-02 DIAGNOSIS — M545 Low back pain, unspecified: Secondary | ICD-10-CM | POA: Insufficient documentation

## 2022-01-02 DIAGNOSIS — I10 Essential (primary) hypertension: Secondary | ICD-10-CM | POA: Diagnosis not present

## 2022-01-02 DIAGNOSIS — Z79899 Other long term (current) drug therapy: Secondary | ICD-10-CM | POA: Insufficient documentation

## 2022-01-02 MED ORDER — LIDOCAINE 5 % EX PTCH
1.0000 | MEDICATED_PATCH | CUTANEOUS | Status: DC
  Administered 2022-01-02: 1 via TRANSDERMAL
  Filled 2022-01-02: qty 1

## 2022-01-02 MED ORDER — KETOROLAC TROMETHAMINE 15 MG/ML IJ SOLN
15.0000 mg | Freq: Once | INTRAMUSCULAR | Status: AC
  Administered 2022-01-02: 15 mg via INTRAMUSCULAR
  Filled 2022-01-02: qty 1

## 2022-01-02 MED ORDER — HYDROCODONE-ACETAMINOPHEN 5-325 MG PO TABS: 1.0000 | ORAL_TABLET | Freq: Four times a day (QID) | ORAL | 0 refills | Status: AC | PRN

## 2022-01-02 MED ORDER — MORPHINE SULFATE (PF) 4 MG/ML IV SOLN
4.0000 mg | Freq: Once | INTRAVENOUS | Status: AC
  Administered 2022-01-02: 4 mg via INTRAVENOUS
  Filled 2022-01-02: qty 1

## 2022-01-02 NOTE — ED Triage Notes (Signed)
Patient complains of acute worsening of chronic lower back pain that started Saturday when he bent over. Seen at [facility] and prescribed cyclobenzaprine and methylprednisolone. Patient states pain is only getting worse. Denies new urinary nor stool incontinence. Patient is alert and oriented at this time. ?

## 2022-01-02 NOTE — ED Provider Triage Note (Signed)
Emergency Medicine Provider Triage Evaluation Note ? ?Brian Gillespie , a 51 y.o. male  was evaluated in triage.  Pt complains of acute worsening of chronic back pain.  Patient reports that last Saturday he bent over and had increasing his pain.  Patient went to [facility] on Sunday where he had an x-ray imaging of his low back.  Patient was given muscle relaxer and course of steroids.  Patient states that pain has been getting progressively worse. ? ?Patient reports that he has had increasing urination however states that he has been drinking more water since starting steroids. ? ?Patient denies any numbness, weakness, saddle anesthesia, bowel/bladder dysfunction, dysuria, hematuria, swelling or tenderness to genitals. ? ?Review of Systems  ?Positive: Lumbar back pain ?Negative: See above ? ?Physical Exam  ?BP 118/80 (BP Location: Left Arm)   Pulse 64   Temp (!) 97.5 ?F (36.4 ?C) (Oral)   Resp 16   SpO2 100%  ?Gen:   Awake, no distress   ?Resp:  Normal effort  ?MSK:   Moves extremities without difficulty  ?Other:  Patient has diffuse tenderness along lumbar back.  Sensation grossly intact to bilateral upper and lower extremities.  Patient able to move both lower extremities without difficulty. ? ?Medical Decision Making  ?Medically screening exam initiated at 11:45 AM.  Appropriate orders placed.  Alexey Sherene Sires was informed that the remainder of the evaluation will be completed by another provider, this initial triage assessment does not replace that evaluation, and the importance of remaining in the ED until their evaluation is complete. ? ? ?  ?Haskel Schroeder, PA-C ?01/02/22 1146 ? ?

## 2022-01-02 NOTE — ED Provider Notes (Signed)
?[facility] ?Provider Note ? ? ?CSN: [id number] ?Arrival date & time: 01/02/22  1120 ? ?  ? ?History ? ?Chief Complaint  ?Patient presents with  ? Back Pain  ? ? ?Brian Sherene SiresS Gillespie is a 51 y.o. male. ? ? ?Back Pain ?Associated symptoms: no abdominal pain, no chest pain, no dysuria, no fever, no headaches, no numbness and no weakness   ? ?51 year old male presents to ED today with 3 to 4-day history of back pain.  Patient has a history of chronic back pain after injury sustained when he was in middle school.  This episode occurred after bending over to place Easter eggs on Saturday afternoon at his church.  He visited an urgent care 2 days ago.  He was prescribed methocarbamol and prednisone without any symptomatic relief.  Pain is described as constant dull with rest and sharp with movement.  Pain radiates along right flank and into testicles.  Patient states that associated symptoms are common whenever his back flares up.  Exacerbated by any kind of movement.  Pain is minimally relieved with rest and laying on either side. He denies trauma, anticoagulant use, lifting excessively heavy objects, excessive torsion, bowel/bladder dysfunction, saddle anesthesia, fever, chills, night sweats, shortness of breath, chest pain, abdominal pain, nausea//vomiting/diarrhea, weakness, paresthesias in extremities, loss of sensation in extremities. ? ?Past medical history significant for hypertension, chronic back pain, GERD ? ?Denies tobacco alcohol, illicit drug use. ? ?Home Medications ?Prior to Admission medications   ?Medication Sig Start Date End Date Taking? Authorizing Provider  ?benazepril (LOTENSIN) 40 MG tablet Take 1 tablet (40 mg total) by mouth daily. 12/20/21   Olevia PerchesJohnson, Megan P, DO  ?hydrochlorothiazide (HYDRODIURIL) 25 MG tablet Take 1 tablet (25 mg total) by mouth daily. 12/20/21   Olevia PerchesJohnson, Megan P, DO  ?HYDROcodone-acetaminophen (NORCO/VICODIN) 5-325 MG tablet Take 1 tablet by mouth  every 6 (six) hours as needed for up to 5 days for severe pain or moderate pain. 01/02/22 01/07/22 Yes Peter Garterobbins, Preethi Scantlebury A, PA  ?meloxicam (MOBIC) 15 MG tablet Take 1 tablet (15 mg total) by mouth daily. 12/20/21   Olevia PerchesJohnson, Megan P, DO  ?methocarbamol (ROBAXIN) 500 MG tablet Take 1 tablet (500 mg total) by mouth 4 (four) times daily. ?Patient not taking: Reported on 12/20/2021 12/02/20   Dorcas CarrowJohnson, Megan P, DO  ?Misc Natural Products (GLUCOSAMINE CHOND COMPLEX/MSM PO) Take 2 each by mouth daily.    [provider]  ?Misc Natural Products (JOINT HEALTH PO) Take 2 each by mouth daily. ?Patient not taking: Reported on 12/20/2021    [provider]  ?omeprazole (PRILOSEC) 40 MG capsule TAKE 1 CAPSULE(40 MG) BY MOUTH DAILY 12/20/21   Olevia PerchesJohnson, Megan P, DO  ?   ? ?Allergies    ?Penicillins   ? ?Review of Systems   ?Review of Systems  ?Constitutional:  Negative for chills, fatigue and fever.  ?HENT:  Negative for congestion, rhinorrhea and sinus pain.   ?Respiratory:  Negative for cough, chest tightness and shortness of breath.   ?Cardiovascular:  Negative for chest pain, palpitations and leg swelling.  ?Gastrointestinal:  Negative for abdominal pain, diarrhea, nausea and vomiting.  ?Genitourinary:  Positive for testicular pain. Negative for difficulty urinating, dysuria, frequency and urgency.  ?Musculoskeletal:  Positive for back pain.  ?     Lower back pain  ?Skin:  Negative for color change, pallor, rash and wound.  ?Neurological:  Negative for speech difficulty, weakness, light-headedness, numbness and headaches.  ? ?Physical Exam ?Updated  Vital Signs ?BP 127/79 (BP Location: Left Arm)   Pulse 60   Temp 98 ?F (36.7 ?C)   Resp 17   SpO2 100%  ?Physical Exam ?Vitals and nursing note reviewed.  ?Constitutional:   ?   General: He is not in acute distress. ?   Appearance: Normal appearance. He is not ill-appearing, toxic-appearing or diaphoretic.  ?Cardiovascular:  ?   Rate and Rhythm: Normal rate and regular  rhythm.  ?   Pulses: Normal pulses.  ?   Heart sounds: Normal heart sounds.  ?Pulmonary:  ?   Effort: Pulmonary effort is normal.  ?   Breath sounds: Normal breath sounds.  ?Abdominal:  ?   General: Abdomen is flat.  ?   Palpations: Abdomen is soft.  ?Musculoskeletal:     ?   General: Tenderness present. No swelling or deformity.  ?   Cervical back: Normal.  ?   Thoracic back: Normal.  ?   Lumbar back: Tenderness and bony tenderness present. No swelling, edema, deformity, signs of trauma, lacerations or spasms. No scoliosis.  ?   Right lower leg: No edema.  ?   Left lower leg: No edema.  ?   Comments: Midline tenderness L4 through sacral spine.  Minimal paraspinal tenderness.  Denies radicular symptoms into bilateral legs.  Muscle strength 5 out of 5 bilaterally in lower extremities.  Pulses intact and full for dorsalis pedis and posterior tibial bilaterally.  No areas of erythema, fluctuance, induration, edema, lesions.  Pain is exacerbated with any kind of movement.  ?Skin: ?   General: Skin is warm and dry.  ?Neurological:  ?   Mental Status: He is alert and oriented to person, place, and time.  ?Psychiatric:     ?   Mood and Affect: Mood normal.     ?   Behavior: Behavior normal. Behavior is cooperative.  ? ? ?ED Results / Procedures / Treatments   ?Labs ?(all labs ordered are listed, but only abnormal results are displayed) ?Labs Reviewed - No data to display ? ?EKG ?None ? ?Radiology ?CT Lumbar Spine Wo Contrast ? ?Result Date: 01/02/2022 ?CLINICAL DATA:  Low back pain EXAM: CT LUMBAR SPINE WITHOUT CONTRAST TECHNIQUE: Multidetector CT imaging of the lumbar spine was performed without intravenous contrast administration. Multiplanar CT image reconstructions were also generated. RADIATION DOSE REDUCTION: This exam was performed according to the departmental dose-optimization program which includes automated exposure control, adjustment of the mA and/or kV according to patient size and/or use of iterative  reconstruction technique. COMPARISON:  None. FINDINGS: Segmentation: 5 lumbar vertebra Alignment: 9 mm anterolisthesis L5-S1.  Remaining alignment normal Vertebrae: Negative for fracture or mass. Bilateral pars defects of L5 appear chronic Paraspinal and other soft tissues: Negative for paraspinous mass or adenopathy. Mild atherosclerotic aorta. Disc levels: L1-2: Moderate disc degeneration with disc space narrowing. Anterior Schmorl's node and surrounding sclerosis. Disc bulging without significant stenosis L2-3: Mild disc and facet degeneration.  Negative for stenosis L3-4: Mild disc and facet degeneration.  Negative for stenosis L4-5: Mild disc and facet degeneration.  Negative for stenosis L5-S1: Advanced disc degeneration. Bilateral pars defects of L5 with grade 1 anterolisthesis. Moderate foraminal encroachment bilaterally. 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 Moderate foraminal stenosis bilaterally. No other areas of significant lumbar stenosis. Electronically Signed   By: Marlan Palau M.D.   On: 01/02/2022 15:13   ? ?Procedures ?Procedures  ? ? ?Medications Ordered in ED ?Medications  ?lidocaine (LIDODERM) 5 %  1 patch (1 patch Transdermal Patch Applied 01/02/22 1445)  ?morphine (PF) 4 MG/ML injection 4 mg (4 mg Intravenous Given 01/02/22 1434)  ?ketorolac (TORADOL) 15 MG/ML injection 15 mg (15 mg Intramuscular Given 01/02/22 1445)  ? ? ?ED Course/ Medical Decision Making/ A&P ?  ?                        ?Medical Decision Making ?Amount and/or Complexity of Data Reviewed ?Radiology: ordered. ? ?Risk ?Prescription drug management. ? ? ?This patient presents to the ED for concern of low back pain, this involves an extensive number of treatment options, and is a complaint that carries with it a high risk of complications and morbidity.  The differential diagnosis includes spondylolisthesis, spondylolysis, sciatica, muscular strain,  ? ? ?Co morbidities that complicate  the patient evaluation ? ?Chronic low back pain ?Hypertension ? ? ?Additional history obtained: ? ?Additional history obtained from Ortho on 12/31/2021 ?External records from outside source obtained and reviewed incl

## 2022-01-02 NOTE — Discharge Instructions (Addendum)
Symptomatic therapy with meloxicam, ice, rest. Take prescription pain medication as indicated.  Establish care with preferred neurologist as discussed for further management of back pain.  Worrisome signs symptoms were discussed with the patient the patient knowledge understanding to return to ED if detected. ?

## 2022-01-03 ENCOUNTER — Encounter: Payer: Self-pay | Admitting: Family Medicine

## 2022-04-25 ENCOUNTER — Other Ambulatory Visit: Payer: Self-pay | Admitting: Family Medicine

## 2022-04-26 NOTE — Telephone Encounter (Signed)
rx was sent to pharmacy on 12/20/21 #90/1  Requested Prescriptions  Pending Prescriptions Disp Refills  . hydrochlorothiazide (HYDRODIURIL) 25 MG tablet [Pharmacy Med Name: HYDROCHLOROTHIAZIDE 25MG  TABLETS] 90 tablet 1    Sig: TAKE 1 TABLET(25 MG) BY MOUTH DAILY     Cardiovascular: Diuretics - Thiazide Passed - 04/25/2022  3:17 PM      Passed - Cr in normal range and within 180 days    Creatinine, Ser  Date Value Ref Range Status  12/20/2021 1.26 0.76 - 1.27 mg/dL Final         Passed - K in normal range and within 180 days    Potassium  Date Value Ref Range Status  12/20/2021 4.3 3.5 - 5.2 mmol/L Final         Passed - Na in normal range and within 180 days    Sodium  Date Value Ref Range Status  12/20/2021 141 134 - 144 mmol/L Final         Passed - Last BP in normal range    BP Readings from Last 1 Encounters:  01/02/22 125/69         Passed - Valid encounter within last 6 months    Recent Outpatient Visits          4 months ago Routine general medical examination at a health care facility   [facility], Megan P, DO   10 months ago Primary hypertension   [facility] [place], Megan P, DO   1 year ago Routine general medical examination at a health care facility   [facility] [place], [place] P, DO   1 year ago Primary hypertension   [facility] [place], [place], NP   2 years ago Essential hypertension   [facility] [place], [place], DO      Future Appointments            In 2 months [place], Laural Benes, DO [facility], [facility]

## 2022-06-25 ENCOUNTER — Encounter: Payer: Self-pay | Admitting: Family Medicine

## 2022-06-25 ENCOUNTER — Ambulatory Visit: Payer: Commercial Managed Care - HMO | Admitting: Family Medicine

## 2022-06-25 VITALS — BP 134/80 | HR 56 | Temp 98.0°F | Ht 73.0 in | Wt 198.3 lb

## 2022-06-25 DIAGNOSIS — I10 Essential (primary) hypertension: Secondary | ICD-10-CM | POA: Diagnosis not present

## 2022-06-25 MED ORDER — HYDROCHLOROTHIAZIDE 25 MG PO TABS: 25.0000 mg | ORAL_TABLET | Freq: Every day | ORAL | 1 refills | Status: DC

## 2022-06-25 MED ORDER — BENAZEPRIL HCL 40 MG PO TABS: 40.0000 mg | ORAL_TABLET | Freq: Every day | ORAL | 1 refills | Status: DC

## 2022-06-25 MED ORDER — METHOCARBAMOL 500 MG PO TABS
500.0000 mg | ORAL_TABLET | Freq: Four times a day (QID) | ORAL | 3 refills | Status: DC
Start: 2022-06-25 — End: 2022-12-25

## 2022-06-25 NOTE — Progress Notes (Signed)
BP 134/80   Pulse (!) 56   Temp 98 F (36.7 C)   Ht 6' 1" (1.854 m)   Wt 198 lb 4.8 oz (89.9 kg)   SpO2 98%   BMI 26.16 kg/m    Subjective:    Patient ID: Brian Gillespie, male    DOB: [birth year], 51 y.o.   MRN: [id number]  HPI: Brian Gillespie is a 51 y.o. male  Chief Complaint  Patient presents with   Hypertension   HYPERTENSION  Hypertension status: controlled  Satisfied with current treatment? yes Duration of hypertension: chronic BP monitoring frequency:  not checking BP range:  BP medication side effects:  no Medication compliance: excellent compliance Previous BP meds:benazepril, HCTZ Aspirin: no Recurrent headaches: no Visual changes: no Palpitations: no Dyspnea: no Chest pain: no Lower extremity edema: no Dizzy/lightheaded: no   Relevant past medical, surgical, family and social history reviewed and updated as indicated. Interim medical history since our last visit reviewed. Allergies and medications reviewed and updated.  Review of Systems  Constitutional: Negative.   Respiratory: Negative.    Cardiovascular: Negative.   Gastrointestinal: Negative.   Musculoskeletal: Negative.   Neurological: Negative.   Psychiatric/Behavioral: Negative.      Per HPI unless specifically indicated above     Objective:    BP 134/80   Pulse (!) 56   Temp 98 F (36.7 C)   Ht 6' 1" (1.854 m)   Wt 198 lb 4.8 oz (89.9 kg)   SpO2 98%   BMI 26.16 kg/m   Wt Readings from Last 3 Encounters:  06/25/22 198 lb 4.8 oz (89.9 kg)  12/20/21 200 lb 12.8 oz (91.1 kg)  06/13/21 191 lb (86.6 kg)    Physical Exam Vitals and nursing note reviewed.  Constitutional:      General: He is not in acute distress.    Appearance: Normal appearance. He is not ill-appearing, toxic-appearing or diaphoretic.  HENT:     Head: Normocephalic and atraumatic.     Right Ear: External ear normal.     Left Ear: External ear normal.     Nose: Nose normal.     Mouth/Throat:     Mouth:  Mucous membranes are moist.     Pharynx: Oropharynx is clear.  Eyes:     General: No scleral icterus.       Right eye: No discharge.        Left eye: No discharge.     Extraocular Movements: Extraocular movements intact.     Conjunctiva/sclera: Conjunctivae normal.     Pupils: Pupils are equal, round, and reactive to light.  Cardiovascular:     Rate and Rhythm: Normal rate and regular rhythm.     Pulses: Normal pulses.     Heart sounds: Normal heart sounds. No murmur heard.    No friction rub. No gallop.  Pulmonary:     Effort: Pulmonary effort is normal. No respiratory distress.     Breath sounds: Normal breath sounds. No stridor. No wheezing, rhonchi or rales.  Chest:     Chest wall: No tenderness.  Musculoskeletal:        General: Normal range of motion.     Cervical back: Normal range of motion and neck supple.  Skin:    General: Skin is warm and dry.     Capillary Refill: Capillary refill takes less than 2 seconds.     Coloration: Skin is not jaundiced or pale.     Findings: No bruising, erythema,  lesion or rash.  Neurological:     General: No focal deficit present.     Mental Status: He is alert and oriented to person, place, and time. Mental status is at baseline.  Psychiatric:        Mood and Affect: Mood normal.        Behavior: Behavior normal.        Thought Content: Thought content normal.        Judgment: Judgment normal.     Results for orders placed or performed in visit on 12/20/21  Comprehensive metabolic panel  Result Value Ref Range   Glucose 85 70 - 99 mg/dL   BUN 27 (H) 6 - 24 mg/dL   Creatinine, Ser 1.26 0.76 - 1.27 mg/dL   eGFR 69 >59 mL/min/1.73   BUN/Creatinine Ratio 21 (H) 9 - 20   Sodium 141 134 - 144 mmol/L   Potassium 4.3 3.5 - 5.2 mmol/L   Chloride 102 96 - 106 mmol/L   CO2 24 20 - 29 mmol/L   Calcium 9.9 8.7 - 10.2 mg/dL   Total Protein 7.0 6.0 - 8.5 g/dL   Albumin 4.7 3.8 - 4.9 g/dL   Globulin, Total 2.3 1.5 - 4.5 g/dL    Albumin/Globulin Ratio 2.0 1.2 - 2.2   Bilirubin Total 0.5 0.0 - 1.2 mg/dL   Alkaline Phosphatase 47 44 - 121 IU/L   AST 23 0 - 40 IU/L   ALT 25 0 - 44 IU/L  CBC with Differential/Platelet  Result Value Ref Range   WBC 5.3 3.4 - 10.8 x10E3/uL   RBC 4.95 4.14 - 5.80 x10E6/uL   Hemoglobin 15.0 13.0 - 17.7 g/dL   Hematocrit 42.6 37.5 - 51.0 %   MCV 86 79 - 97 fL   MCH 30.3 26.6 - 33.0 pg   MCHC 35.2 31.5 - 35.7 g/dL   RDW 12.8 11.6 - 15.4 %   Platelets 278 150 - 450 x10E3/uL   Neutrophils 53 Not Estab. %   Lymphs 35 Not Estab. %   Monocytes 9 Not Estab. %   Eos 2 Not Estab. %   Basos 1 Not Estab. %   Neutrophils Absolute 2.8 1.4 - 7.0 x10E3/uL   Lymphocytes Absolute 1.9 0.7 - 3.1 x10E3/uL   Monocytes Absolute 0.5 0.1 - 0.9 x10E3/uL   EOS (ABSOLUTE) 0.1 0.0 - 0.4 x10E3/uL   Basophils Absolute 0.1 0.0 - 0.2 x10E3/uL   Immature Granulocytes 0 Not Estab. %   Immature Grans (Abs) 0.0 0.0 - 0.1 x10E3/uL  Lipid Panel w/o Chol/HDL Ratio  Result Value Ref Range   Cholesterol, Total 220 (H) 100 - 199 mg/dL   Triglycerides 157 (H) 0 - 149 mg/dL   HDL 45 >39 mg/dL   VLDL Cholesterol Cal 28 5 - 40 mg/dL   LDL Chol Calc (NIH) 147 (H) 0 - 99 mg/dL  PSA  Result Value Ref Range   Prostate Specific Ag, Serum 1.7 0.0 - 4.0 ng/mL  TSH  Result Value Ref Range   TSH 1.300 0.450 - 4.500 uIU/mL  Urinalysis, Routine w reflex microscopic  Result Value Ref Range   Specific Gravity, UA 1.025 1.005 - 1.030   pH, UA 6.5 5.0 - 7.5   Color, UA Yellow Yellow   Appearance Ur Clear Clear   Leukocytes,UA Negative Negative   Protein,UA Negative Negative/Trace   Glucose, UA Negative Negative   Ketones, UA Negative Negative   RBC, UA Negative Negative   Bilirubin, UA Negative Negative  Urobilinogen, Ur 0.2 0.2 - 1.0 mg/dL   Nitrite, UA Negative Negative  Microalbumin, Urine Waived  Result Value Ref Range   Microalb, Ur Waived 10 0 - 19 mg/L   Creatinine, Urine Waived 200 10 - 300 mg/dL    Microalb/Creat Ratio <30 <30 mg/g  HIV Antibody (routine testing w rflx)  Result Value Ref Range   HIV Screen 4th Generation wRfx Non Reactive Non Reactive  Hepatitis C Antibody  Result Value Ref Range   Hep C Virus Ab Non Reactive Non Reactive      Assessment & Plan:   Problem List Items Addressed This Visit       Cardiovascular and Mediastinum   HTN (hypertension) - Primary    Under good control on current regimen. Continue current regimen. Continue to monitor. Call with any concerns. Refills given. Labs drawn today.       Relevant Medications   hydrochlorothiazide (HYDRODIURIL) 25 MG tablet   benazepril (LOTENSIN) 40 MG tablet   Other Relevant Orders   Basic metabolic panel     Follow up plan: Return in about 6 months (around 12/25/2022) for physical.

## 2022-06-25 NOTE — Assessment & Plan Note (Signed)
Under good control on current regimen. Continue current regimen. Continue to monitor. Call with any concerns. Refills given. Labs drawn today.   

## 2022-06-26 LAB — BASIC METABOLIC PANEL
BUN/Creatinine Ratio: 13 (ref 9–20)
BUN: 16 mg/dL (ref 6–24)
CO2: 25 mmol/L (ref 20–29)
Calcium: 10.3 mg/dL — ABNORMAL HIGH (ref 8.7–10.2)
Chloride: 100 mmol/L (ref 96–106)
Creatinine, Ser: 1.26 mg/dL (ref 0.76–1.27)
Glucose: 82 mg/dL (ref 70–99)
Potassium: 4.5 mmol/L (ref 3.5–5.2)
Sodium: 138 mmol/L (ref 134–144)
eGFR: 69 mL/min/{1.73_m2} (ref >59)

## 2022-10-11 IMAGING — CT CT L SPINE W/O CM
3 series · 10 of 33 positions shown, 11 images · non-contrast
Comparison: None.

CLINICAL DATA: Low back pain



[Series 5: l spine 2.0 st · axial · 0.30mm/px · z∈[-207,-69]mm · 2 of 147 slices shown, 3 images]
[im 45/147  soft-tissue]
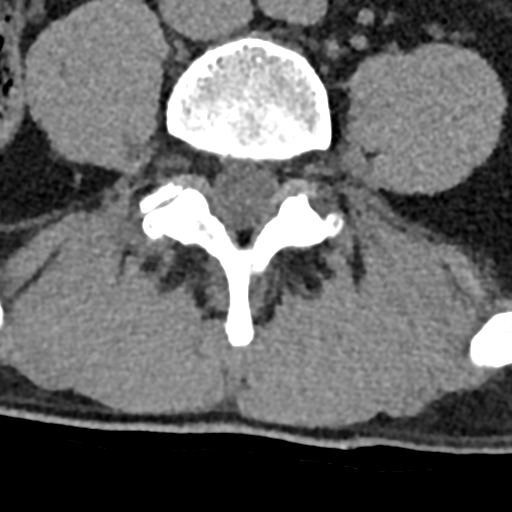
[im 45/147  bone]
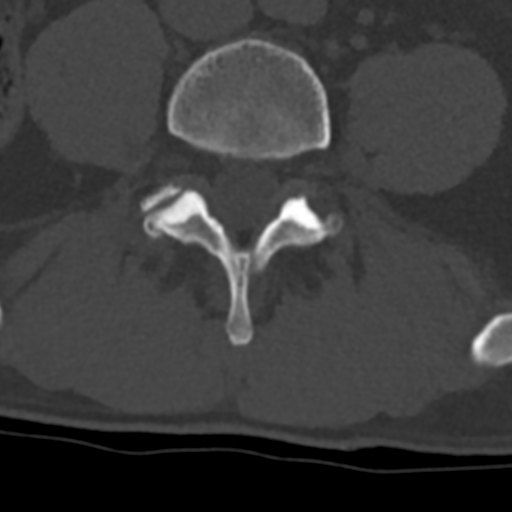
[im 113/147  bone]
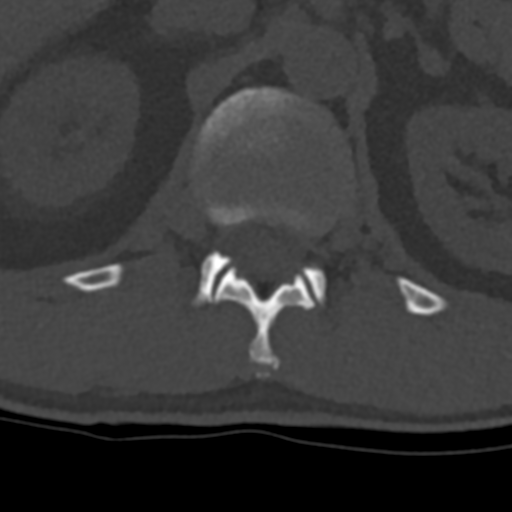

[Series 8: coronal st · coronal · 0.24mm/px · 3 of 81 slices shown]
[im 17/81  bone]
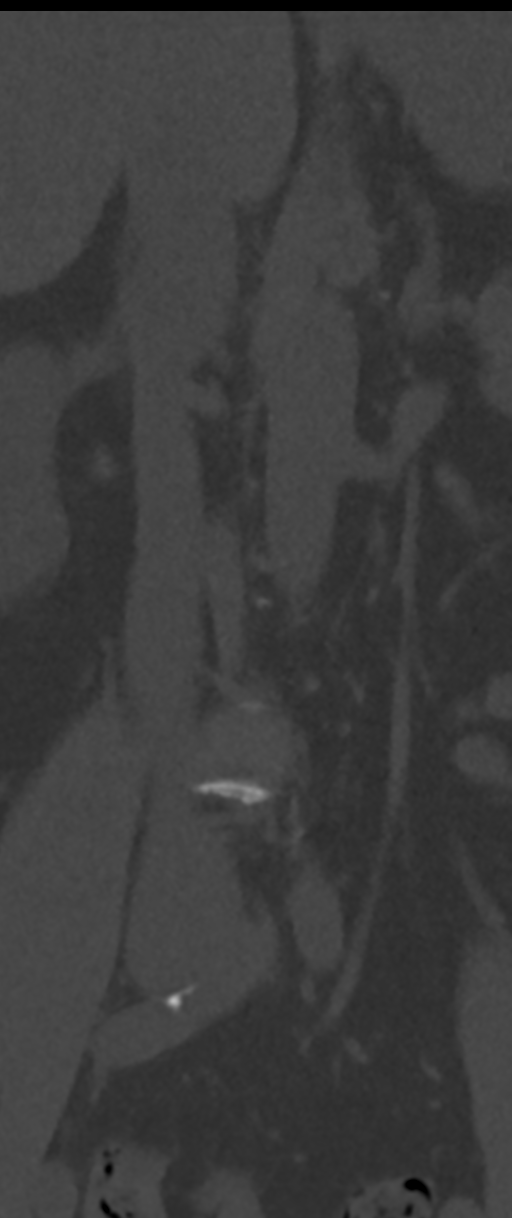
[im 33/81  bone]
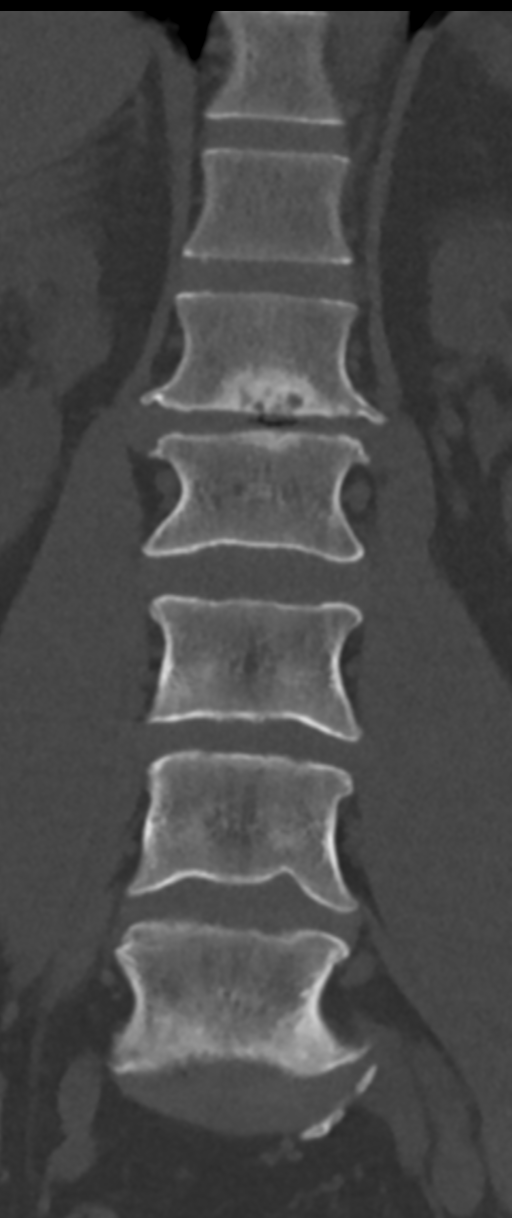
[im 49/81  bone]
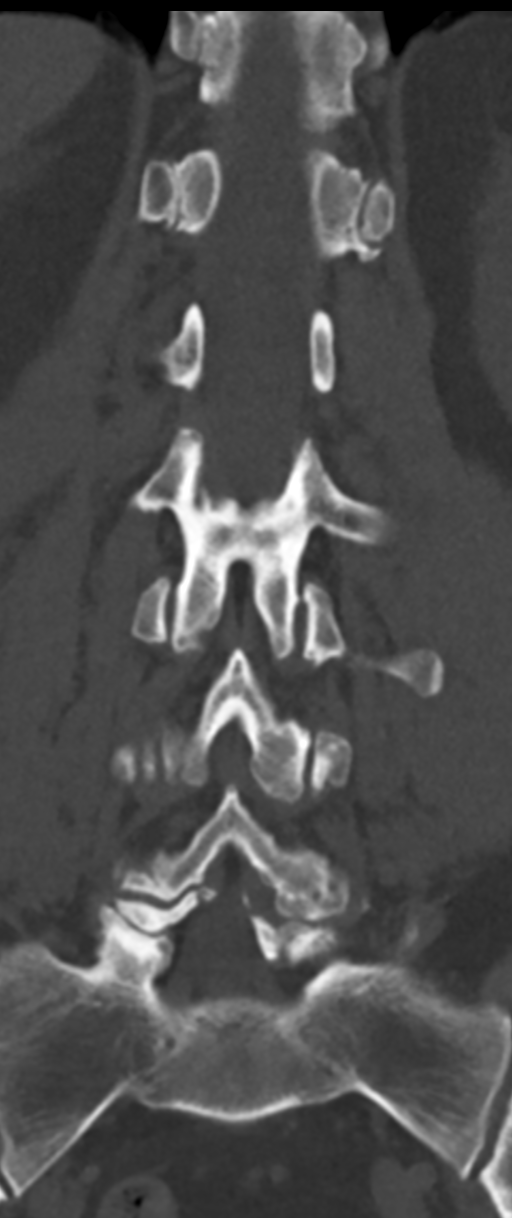

[Series 9: sagittal st · sagittal · 0.31mm/px · 5 of 61 slices shown]
[im 21/61  bone]
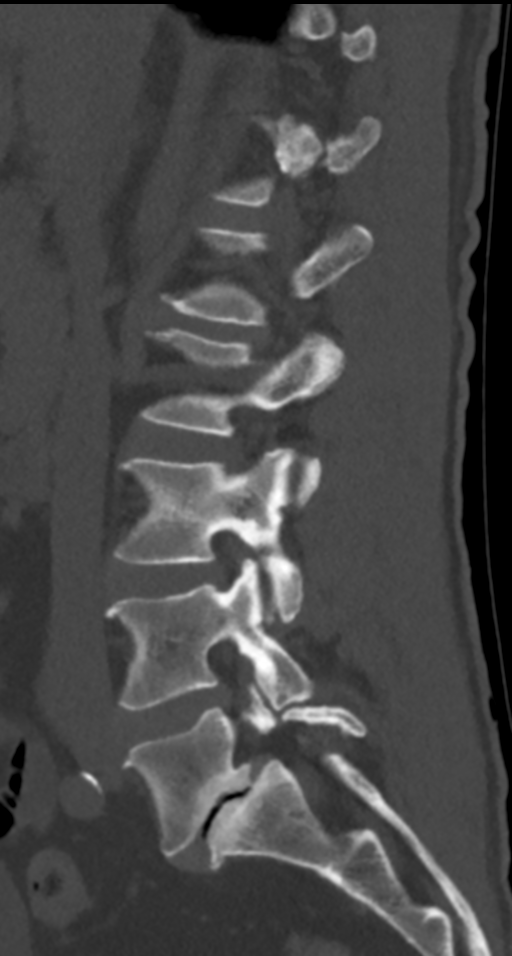
[im 26/61  bone]
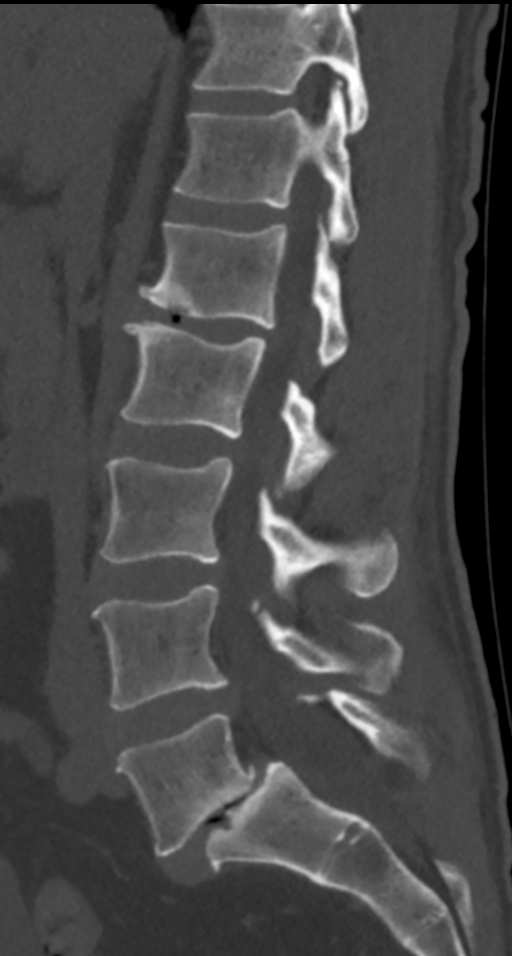
[im 31/61  bone]
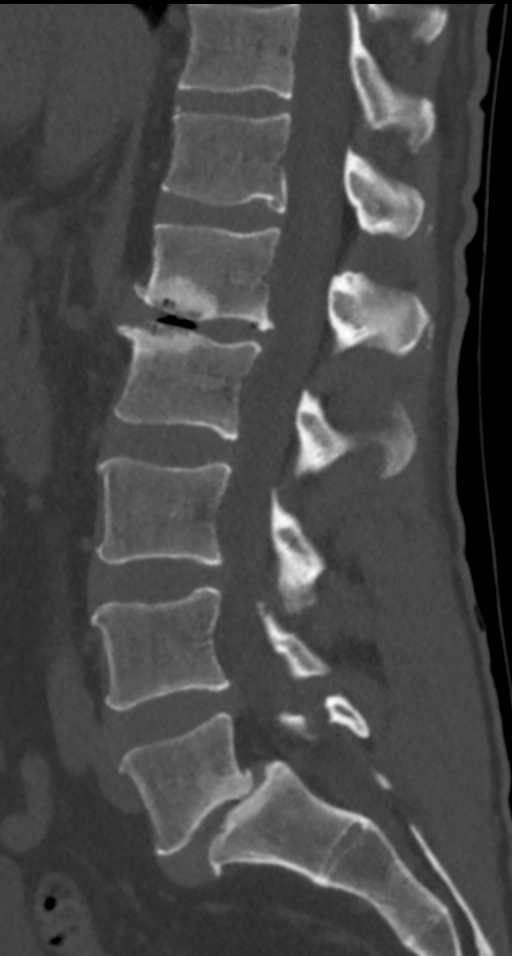
[im 36/61  bone]
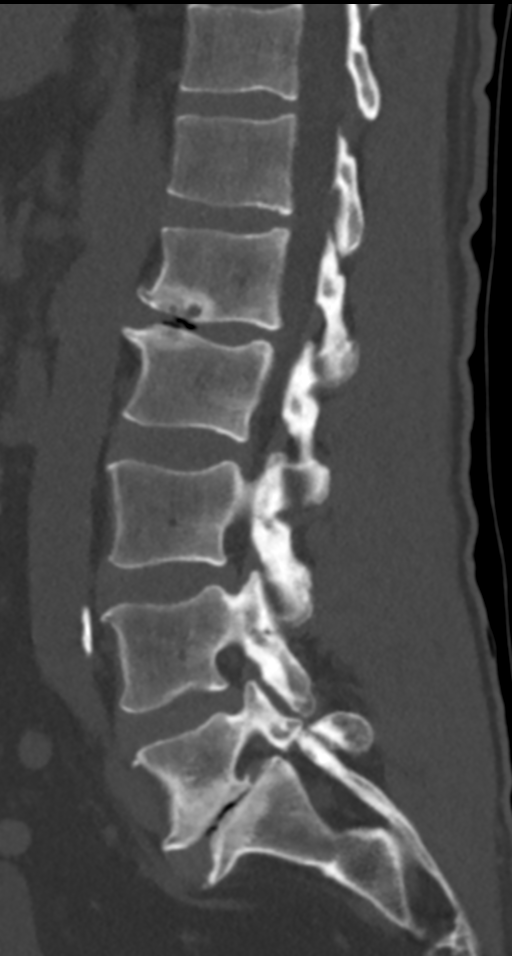
[im 41/61  bone]
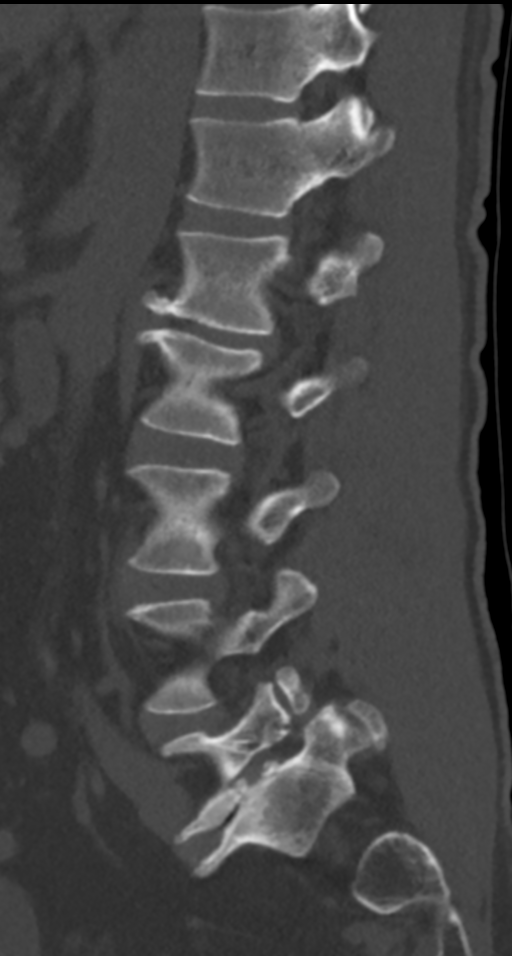

[10 of 33 positions shown; findings below may reference images not displayed]

FINDINGS: Segmentation: 5 lumbar vertebra

Alignment: 9 mm anterolisthesis L5-S1.  Remaining alignment normal

Vertebrae: Negative for fracture or mass. Bilateral pars defects of
L5 appear chronic

Paraspinal and other soft tissues: Negative for paraspinous mass or
adenopathy. Mild atherosclerotic aorta.

Disc levels: L1-2: Moderate disc degeneration with disc space
narrowing. Anterior Schmorl's node and surrounding sclerosis. Disc
bulging without significant stenosis

L2-3: Mild disc and facet degeneration.  Negative for stenosis

L3-4: Mild disc and facet degeneration.  Negative for stenosis

L4-5: Mild disc and facet degeneration.  Negative for stenosis

L5-S1: Advanced disc degeneration. Bilateral pars defects of L5 with
grade 1 anterolisthesis. Moderate foraminal encroachment
bilaterally.
IMPRESSION: 9 mm anterolisthesis L5-S1 with chronic bilateral pars defects of
L5. Moderate foraminal stenosis bilaterally.

No other areas of significant lumbar stenosis.

## 2022-11-25 ENCOUNTER — Other Ambulatory Visit: Payer: Self-pay | Admitting: Family Medicine

## 2022-11-27 NOTE — Telephone Encounter (Signed)
Requested Prescriptions  Refused Prescriptions Disp Refills   hydrochlorothiazide (HYDRODIURIL) 25 MG tablet [Pharmacy Med Name: HYDROCHLOROTHIAZIDE '25MG'$  TABLETS] 90 tablet 1    Sig: TAKE 1 TABLET(25 MG) BY MOUTH DAILY     Cardiovascular: Diuretics - Thiazide Passed - 11/25/2022 10:27 PM      Passed - Cr in normal range and within 180 days    Creatinine, Ser  Date Value Ref Range Status  06/25/2022 1.26 0.76 - 1.27 mg/dL Final         Passed - K in normal range and within 180 days    Potassium  Date Value Ref Range Status  06/25/2022 4.5 3.5 - 5.2 mmol/L Final         Passed - Na in normal range and within 180 days    Sodium  Date Value Ref Range Status  06/25/2022 138 134 - 144 mmol/L Final         Passed - Last BP in normal range    BP Readings from Last 1 Encounters:  06/25/22 134/80         Passed - Valid encounter within last 6 months    Recent Outpatient Visits           5 months ago Primary hypertension   [place], [place] P, DO   11 months ago Routine general medical examination at a health care facility   [place], [place], DO   1 year ago Primary hypertension   [place], [place], DO   1 year ago Routine general medical examination at a health care facility   [place], [place], DO   2 years ago Primary hypertension   [place], Lauren A, NP       Future Appointments             In 4 weeks Valerie Roys, DO [place]

## 2022-12-25 ENCOUNTER — Encounter: Payer: Self-pay | Admitting: Family Medicine

## 2022-12-25 ENCOUNTER — Ambulatory Visit: Payer: 59 | Admitting: Family Medicine

## 2022-12-25 VITALS — BP 105/61 | HR 50 | Temp 98.2°F | Ht 73.0 in | Wt 199.7 lb

## 2022-12-25 DIAGNOSIS — I10 Essential (primary) hypertension: Secondary | ICD-10-CM

## 2022-12-25 DIAGNOSIS — Z1211 Encounter for screening for malignant neoplasm of colon: Secondary | ICD-10-CM | POA: Diagnosis not present

## 2022-12-25 DIAGNOSIS — Z0000 Encounter for general adult medical examination without abnormal findings: Secondary | ICD-10-CM | POA: Diagnosis not present

## 2022-12-25 LAB — MICROSCOPIC EXAMINATION
Bacteria, UA: NONE SEEN
Epithelial Cells (non renal): NONE SEEN /hpf (ref 0–10)
RBC, Urine: NONE SEEN /hpf (ref 0–2)

## 2022-12-25 LAB — URINALYSIS, ROUTINE W REFLEX MICROSCOPIC
Bilirubin, UA: NEGATIVE
Glucose, UA: NEGATIVE
Ketones, UA: NEGATIVE
Leukocytes,UA: NEGATIVE
Nitrite, UA: NEGATIVE
RBC, UA: NEGATIVE
Specific Gravity, UA: 1.02 (ref 1.005–1.030)
Urobilinogen, Ur: 1 mg/dL (ref 0.2–1.0)
pH, UA: 7.5 (ref 5.0–7.5)

## 2022-12-25 LAB — MICROALBUMIN, URINE WAIVED
Creatinine, Urine Waived: 200 mg/dL (ref 10–300)
Microalb, Ur Waived: 30 mg/L — ABNORMAL HIGH (ref 0–19)
Microalb/Creat Ratio: <30 mg/g (ref <30)

## 2022-12-25 MED ORDER — HYDROCHLOROTHIAZIDE 25 MG PO TABS: 25.0000 mg | ORAL_TABLET | Freq: Every day | ORAL | 1 refills | Status: DC

## 2022-12-25 MED ORDER — OMEPRAZOLE 40 MG PO CPDR: DELAYED_RELEASE_CAPSULE | ORAL | 3 refills | Status: DC

## 2022-12-25 MED ORDER — BENAZEPRIL HCL 40 MG PO TABS
40.0000 mg | ORAL_TABLET | Freq: Every day | ORAL | 1 refills | Status: DC
Start: 2022-12-25 — End: 2023-06-18

## 2022-12-25 MED ORDER — MELOXICAM 15 MG PO TABS: 15.0000 mg | ORAL_TABLET | Freq: Every day | ORAL | 3 refills | Status: DC

## 2022-12-25 MED ORDER — METHOCARBAMOL 500 MG PO TABS
500.0000 mg | ORAL_TABLET | Freq: Four times a day (QID) | ORAL | 3 refills | Status: AC
Start: 2022-12-25 — End: ?

## 2022-12-25 NOTE — Progress Notes (Signed)
BP 105/61   Pulse (!) 50   Temp 98.2 F (36.8 C) (Oral)   Ht 6\' 1"  (1.854 m)   Wt 199 lb 11.2 oz (90.6 kg)   SpO2 98%   BMI 26.35 kg/m    Subjective:    Patient ID: Brian Gillespie, male    DOB: [birth year], 52 y.o.   MRN: [id number]  HPI: Brian Gillespie is a 52 y.o. male presenting on 12/25/2022 for comprehensive medical examination. Current medical complaints include:  HYPERTENSION  Hypertension status: controlled  Satisfied with current treatment? yes Duration of hypertension: chronic BP monitoring frequency:  not checking BP medication side effects:  no Medication compliance: excellent compliance Previous BP meds: benzapril, hctz Aspirin: no Recurrent headaches: no Visual changes: no Palpitations: no Dyspnea: no Chest pain: no Lower extremity edema: no Dizzy/lightheaded: no  He currently lives with: wife Interim Problems from his last visit: no  Depression Screen done today and results listed below:     12/25/2022    8:06 AM 06/25/2022    8:12 AM 12/20/2021   10:24 AM 12/02/2020    8:59 AM 02/05/2020    3:02 PM  Depression screen PHQ 2/9  Decreased Interest 0 0 0 0 0  Down, Depressed, Hopeless 0 0 0 0 0  PHQ - 2 Score 0 0 0 0 0  Altered sleeping 0 0 0    Tired, decreased energy 0 0 0    Change in appetite 0 0 0    Feeling bad or failure about yourself  0 0 0    Trouble concentrating 0 0 0    Moving slowly or fidgety/restless 0 0 0    Suicidal thoughts 0 0 0    PHQ-9 Score 0 0 0    Difficult doing work/chores Not difficult at all Not difficult at all Not difficult at all      Past Medical History:  Past Medical History:  Diagnosis Date   Esophageal stenosis    Esophageal tear    Hypertension     Surgical History:  No past surgical history on file.  Medications:  Current Outpatient Medications on File Prior to Visit  Medication Sig   Misc Natural Products (GLUCOSAMINE CHOND COMPLEX/MSM PO) Take 2 each by mouth daily.   No current  facility-administered medications on file prior to visit.    Allergies:  Allergies  Allergen Reactions   Penicillins Rash    Reaction: Childhood    Social History:  Social History   Socioeconomic History   Marital status: Married    Spouse name: Not on file   Number of children: Not on file   Years of education: Not on file   Highest education level: Bachelor's degree (e.g., BA, AB, BS)  Occupational History   Not on file  Tobacco Use   Smoking status: Never   Smokeless tobacco: Former    Quit date: 09/24/2004  Vaping Use   Vaping Use: Never used  Substance and Sexual Activity   Alcohol use: No   Drug use: No   Sexual activity: Yes  Other Topics Concern   Not on file  Social History Narrative   Not on file   Social Determinants of Health   Financial Resource Strain: Low Risk  (12/24/2022)   Overall Financial Resource Strain (CARDIA)    Difficulty of Paying Living Expenses: Not very hard  Food Insecurity: No Food Insecurity (12/24/2022)   Hunger Vital Sign    Worried About Running Out of Food  in the Last Year: Never true    [place] in the Last Year: Never true  Transportation Needs: No Transportation Needs (12/24/2022)   PRAPARE - Hydrologist (Medical): No    Lack of Transportation (Non-Medical): No  Physical Activity: Sufficiently Active (12/24/2022)   Exercise Vital Sign    Days of Exercise per Week: 5 days    Minutes of Exercise per Session: 90 min  Stress: No Stress Concern Present (12/24/2022)   [place]    Feeling of Stress : Only a little  Social Connections: Socially Integrated (12/24/2022)   Social Connection and Isolation Panel [NHANES]    Frequency of Communication with Friends and Family: Three times a week    Frequency of Social Gatherings with Friends and Family: Twice a week    Attends Religious Services: More than 4 times per year    Active Member of  [employer] or Organizations: Yes    Attends Music therapist: More than 4 times per year    Marital Status: Married  Human resources officer Violence: Not on file   Social History   Tobacco Use  Smoking Status Never  Smokeless Tobacco Former   Quit date: 09/24/2004   Social History   Substance and Sexual Activity  Alcohol Use No    Family History:  Family History  Problem Relation Age of Onset   Cancer Paternal Grandmother    Cancer Paternal Grandfather     Past medical history, surgical history, medications, allergies, family history and social history reviewed with patient today and changes made to appropriate areas of the chart.   Review of Systems  Constitutional: Negative.   HENT: Negative.    Eyes: Negative.   Respiratory: Negative.    Cardiovascular: Negative.   Gastrointestinal: Negative.   Genitourinary: Negative.   Musculoskeletal: Negative.   Skin: Negative.   Neurological: Negative.   Endo/Heme/Allergies: Negative.   Psychiatric/Behavioral: Negative.     All other ROS negative except what is listed above and in the HPI.      Objective:    BP 105/61   Pulse (!) 50   Temp 98.2 F (36.8 C) (Oral)   Ht 6\' 1"  (1.854 m)   Wt 199 lb 11.2 oz (90.6 kg)   SpO2 98%   BMI 26.35 kg/m   Wt Readings from Last 3 Encounters:  12/25/22 199 lb 11.2 oz (90.6 kg)  06/25/22 198 lb 4.8 oz (89.9 kg)  12/20/21 200 lb 12.8 oz (91.1 kg)    Physical Exam Vitals and nursing note reviewed.  Constitutional:      General: He is not in acute distress.    Appearance: Normal appearance. He is not ill-appearing, toxic-appearing or diaphoretic.  HENT:     Head: Normocephalic and atraumatic.     Right Ear: Tympanic membrane, ear canal and external ear normal. There is no impacted cerumen.     Left Ear: Tympanic membrane, ear canal and external ear normal. There is no impacted cerumen.     Nose: Nose normal. No congestion or rhinorrhea.     Mouth/Throat:     Mouth: Mucous  membranes are moist.     Pharynx: Oropharynx is clear. No oropharyngeal exudate or posterior oropharyngeal erythema.  Eyes:     General: No scleral icterus.       Right eye: No discharge.        Left eye: No discharge.  Extraocular Movements: Extraocular movements intact.     Conjunctiva/sclera: Conjunctivae normal.     Pupils: Pupils are equal, round, and reactive to light.  Neck:     Vascular: No carotid bruit.  Cardiovascular:     Rate and Rhythm: Normal rate and regular rhythm.     Pulses: Normal pulses.     Heart sounds: No murmur heard.    No friction rub. No gallop.  Pulmonary:     Effort: Pulmonary effort is normal. No respiratory distress.     Breath sounds: Normal breath sounds. No stridor. No wheezing, rhonchi or rales.  Chest:     Chest wall: No tenderness.  Abdominal:     General: Abdomen is flat. Bowel sounds are normal. There is no distension.     Palpations: Abdomen is soft. There is no mass.     Tenderness: There is no abdominal tenderness. There is no right CVA tenderness, left CVA tenderness, guarding or rebound.     Hernia: No hernia is present.  Genitourinary:    Comments: Genital exam deferred with shared decision making Musculoskeletal:        General: No swelling, tenderness, deformity or signs of injury.     Cervical back: Normal range of motion and neck supple. No rigidity. No muscular tenderness.     Right lower leg: No edema.     Left lower leg: No edema.  Lymphadenopathy:     Cervical: No cervical adenopathy.  Skin:    General: Skin is warm and dry.     Capillary Refill: Capillary refill takes less than 2 seconds.     Coloration: Skin is not jaundiced or pale.     Findings: No bruising, erythema, lesion or rash.  Neurological:     General: No focal deficit present.     Mental Status: He is alert and oriented to person, place, and time.     Cranial Nerves: No cranial nerve deficit.     Sensory: No sensory deficit.     Motor: No weakness.      Coordination: Coordination normal.     Gait: Gait normal.     Deep Tendon Reflexes: Reflexes normal.  Psychiatric:        Mood and Affect: Mood normal.        Behavior: Behavior normal.        Thought Content: Thought content normal.        Judgment: Judgment normal.     Results for orders placed or performed in visit on [id number]  Basic metabolic panel  Result Value Ref Range   Glucose 82 70 - 99 mg/dL   BUN 16 6 - 24 mg/dL   Creatinine, Ser 1.26 0.76 - 1.27 mg/dL   eGFR 69 >59 mL/min/1.73   BUN/Creatinine Ratio 13 9 - 20   Sodium 138 134 - 144 mmol/L   Potassium 4.5 3.5 - 5.2 mmol/L   Chloride 100 96 - 106 mmol/L   CO2 25 20 - 29 mmol/L   Calcium 10.3 (H) 8.7 - 10.2 mg/dL      Assessment & Plan:   Problem List Items Addressed This Visit       Cardiovascular and Mediastinum   HTN (hypertension)    Under good control on current regimen. Continue current regimen. Continue to monitor. Call with any concerns. Refills given. Labs drawn today.       Relevant Medications   benazepril (LOTENSIN) 40 MG tablet   hydrochlorothiazide (HYDRODIURIL) 25 MG tablet   Other Relevant  Orders   Microalbumin, Urine Waived   Other Visit Diagnoses     Routine general medical examination at a health care facility    -  Primary   Vaccines up to date. Screening labs checked today. Cologuard ordered today. Continue diet and exercise. Call with any concerns.   Relevant Orders   Comprehensive metabolic panel   CBC with Differential/Platelet   Lipid Panel w/o Chol/HDL Ratio   PSA   TSH   Urinalysis, Routine w reflex microscopic   Screening for colon cancer       Cologuard due in June, will order today.   Relevant Orders   Cologuard        LABORATORY TESTING:  Health maintenance labs ordered today as discussed above.   The natural history of prostate cancer and ongoing controversy regarding screening and potential treatment outcomes of prostate cancer has been discussed with the  patient. The meaning of a false positive PSA and a false negative PSA has been discussed. He indicates understanding of the limitations of this screening test and wishes to proceed with screening PSA testing.   IMMUNIZATIONS:   - Tdap: Tetanus vaccination status reviewed: last tetanus booster within 10 years. - Influenza: Postponed to flu season - Pneumovax: Not applicable - Prevnar: Not applicable - COVID: Refused - HPV: Not applicable - Shingrix vaccine:  wants to wait until next visit  SCREENING: - Colonoscopy: Up to date  Discussed with patient purpose of the colonoscopy is to detect colon cancer at curable precancerous or early stages   PATIENT COUNSELING:    Sexuality: Discussed sexually transmitted diseases, partner selection, use of condoms, avoidance of unintended pregnancy  and contraceptive alternatives.   Advised to avoid cigarette smoking.  I discussed with the patient that most people either abstain from alcohol or drink within safe limits (<=14/week and <=4 drinks/occasion for males, <=7/weeks and <= 3 drinks/occasion for females) and that the risk for alcohol disorders and other health effects rises proportionally with the number of drinks per week and how often a drinker exceeds daily limits.  Discussed cessation/primary prevention of drug use and availability of treatment for abuse.   Diet: Encouraged to adjust caloric intake to maintain  or achieve ideal body weight, to reduce intake of dietary saturated fat and total fat, to limit sodium intake by avoiding high sodium foods and not adding table salt, and to maintain adequate dietary potassium and calcium preferably from fresh fruits, vegetables, and low-fat dairy products.    stressed the importance of regular exercise  Injury prevention: Discussed safety belts, safety helmets, smoke detector, smoking near bedding or upholstery.   Dental health: Discussed importance of regular tooth brushing, flossing, and dental  visits.   Follow up plan: NEXT PREVENTATIVE PHYSICAL DUE IN 1 YEAR. No follow-ups on file.

## 2022-12-25 NOTE — Assessment & Plan Note (Signed)
Under good control on current regimen. Continue current regimen. Continue to monitor. Call with any concerns. Refills given. Labs drawn today.   

## 2022-12-26 ENCOUNTER — Encounter: Payer: Self-pay | Admitting: Family Medicine

## 2022-12-26 LAB — COMPREHENSIVE METABOLIC PANEL
ALT: 23 IU/L (ref 0–44)
AST: 22 IU/L (ref 0–40)
Albumin/Globulin Ratio: 1.8 (ref 1.2–2.2)
Albumin: 4.4 g/dL (ref 3.8–4.9)
Alkaline Phosphatase: 52 IU/L (ref 44–121)
BUN/Creatinine Ratio: 22 — ABNORMAL HIGH (ref 9–20)
BUN: 25 mg/dL — ABNORMAL HIGH (ref 6–24)
Bilirubin Total: 0.3 mg/dL (ref 0.0–1.2)
CO2: 24 mmol/L (ref 20–29)
Calcium: 9.8 mg/dL (ref 8.7–10.2)
Chloride: 101 mmol/L (ref 96–106)
Creatinine, Ser: 1.16 mg/dL (ref 0.76–1.27)
Globulin, Total: 2.4 g/dL (ref 1.5–4.5)
Glucose: 85 mg/dL (ref 70–99)
Potassium: 4.2 mmol/L (ref 3.5–5.2)
Sodium: 141 mmol/L (ref 134–144)
Total Protein: 6.8 g/dL (ref 6.0–8.5)
eGFR: 75 mL/min/{1.73_m2} (ref >59)

## 2022-12-26 LAB — LIPID PANEL W/O CHOL/HDL RATIO
Cholesterol, Total: 198 mg/dL (ref 100–199)
HDL: 43 mg/dL (ref >39)
LDL Chol Calc (NIH): 126 mg/dL — ABNORMAL HIGH (ref 0–99)
Triglycerides: 163 mg/dL — ABNORMAL HIGH (ref 0–149)
VLDL Cholesterol Cal: 29 mg/dL (ref 5–40)

## 2022-12-26 LAB — CBC WITH DIFFERENTIAL/PLATELET
Basophils Absolute: 0.1 10*3/uL (ref 0.0–0.2)
Basos: 1 %
EOS (ABSOLUTE): 0.1 10*3/uL (ref 0.0–0.4)
Eos: 2 %
Hematocrit: 44.2 % (ref 37.5–51.0)
Hemoglobin: 15.3 g/dL (ref 13.0–17.7)
Immature Grans (Abs): 0 10*3/uL (ref 0.0–0.1)
Immature Granulocytes: 0 %
Lymphocytes Absolute: 2.2 10*3/uL (ref 0.7–3.1)
Lymphs: 36 %
MCH: 30.8 pg (ref 26.6–33.0)
MCHC: 34.6 g/dL (ref 31.5–35.7)
MCV: 89 fL (ref 79–97)
Monocytes Absolute: 0.5 10*3/uL (ref 0.1–0.9)
Monocytes: 9 %
Neutrophils Absolute: 3.2 10*3/uL (ref 1.4–7.0)
Neutrophils: 52 %
Platelets: 273 10*3/uL (ref 150–450)
RBC: 4.96 x10E6/uL (ref 4.14–5.80)
RDW: 13.1 % (ref 11.6–15.4)
WBC: 6.1 10*3/uL (ref 3.4–10.8)

## 2022-12-26 LAB — TSH: TSH: 1.68 u[IU]/mL (ref 0.450–4.500)

## 2022-12-26 LAB — PSA: Prostate Specific Ag, Serum: 1.9 ng/mL (ref 0.0–4.0)

## 2022-12-28 NOTE — Addendum Note (Signed)
Addended by: Dorcas Carrow on: 12/28/2022 04:18 PM   Modules accepted: Orders

## 2023-05-01 ENCOUNTER — Other Ambulatory Visit: Payer: Self-pay | Admitting: Family Medicine

## 2023-05-01 DIAGNOSIS — Z1211 Encounter for screening for malignant neoplasm of colon: Secondary | ICD-10-CM

## 2023-06-15 ENCOUNTER — Other Ambulatory Visit: Payer: Self-pay | Admitting: Family Medicine

## 2023-06-18 NOTE — Telephone Encounter (Signed)
Requested Prescriptions  Pending Prescriptions Disp Refills   benazepril (LOTENSIN) 40 MG tablet [Pharmacy Med Name: BENAZEPRIL HCL 40 MG TABLET] 90 tablet 2    Sig: TAKE 1 TABLET BY MOUTH EVERY DAY     Cardiovascular:  ACE Inhibitors Passed - 06/15/2023  1:32 PM      Passed - Cr in normal range and within 180 days    Creatinine, Ser  Date Value Ref Range Status  12/25/2022 1.16 0.76 - 1.27 mg/dL Final         Passed - K in normal range and within 180 days    Potassium  Date Value Ref Range Status  12/25/2022 4.2 3.5 - 5.2 mmol/L Final         Passed - Patient is not pregnant      Passed - Last BP in normal range    BP Readings from Last 1 Encounters:  12/25/22 105/61         Passed - Valid encounter within last 6 months    Recent Outpatient Visits           5 months ago Routine general medical examination at a health care facility   [facility], [place] P, DO   11 months ago Primary hypertension   [facility] [employer], [place] P, DO   1 year ago Routine general medical examination at a health care facility   [facility] [place], [place] P, DO   2 years ago Primary hypertension   [facility] [place], [place] P, DO   2 years ago Routine general medical examination at a health care facility   [facility], [place] P, DO               hydrochlorothiazide (HYDRODIURIL) 25 MG tablet [Pharmacy Med Name: HYDROCHLOROTHIAZIDE 25 MG TAB] 90 tablet 2    Sig: TAKE 1 TABLET (25 MG TOTAL) BY MOUTH DAILY.     Cardiovascular: Diuretics - Thiazide Passed - 06/15/2023  1:32 PM      Passed - Cr in normal range and within 180 days    Creatinine, Ser  Date Value Ref Range Status  12/25/2022 1.16 0.76 - 1.27 mg/dL Final         Passed - K in normal range and within 180 days    Potassium  Date Value Ref Range Status  12/25/2022 4.2 3.5 - 5.2 mmol/L Final          Passed - Na in normal range and within 180 days    Sodium  Date Value Ref Range Status  12/25/2022 141 134 - 144 mmol/L Final         Passed - Last BP in normal range    BP Readings from Last 1 Encounters:  12/25/22 105/61         Passed - Valid encounter within last 6 months    Recent Outpatient Visits           5 months ago Routine general medical examination at a health care facility   [facility], Megan P, DO   11 months ago Primary hypertension   [facility] [place], [place] P, DO   1 year ago Routine general medical examination at a health care facility   [facility] [place], [place] P, DO   2 years ago Primary hypertension   [facility] [place], [place] P, DO   2 years ago Routine general medical examination at a health care facility   [facility], [place] P, [place]

## 2023-07-22 DIAGNOSIS — Z1211 Encounter for screening for malignant neoplasm of colon: Secondary | ICD-10-CM | POA: Diagnosis not present

## 2023-07-29 LAB — COLOGUARD: COLOGUARD: POSITIVE — AB

## 2023-08-23 DIAGNOSIS — H16042 Marginal corneal ulcer, left eye: Secondary | ICD-10-CM | POA: Diagnosis not present

## 2023-09-30 ENCOUNTER — Other Ambulatory Visit: Payer: Self-pay | Admitting: Family Medicine

## 2023-09-30 ENCOUNTER — Encounter: Payer: Self-pay | Admitting: Family Medicine

## 2023-10-01 NOTE — Telephone Encounter (Signed)
 Requested Prescriptions  Pending Prescriptions Disp Refills   benazepril  (LOTENSIN ) 40 MG tablet [Pharmacy Med Name: BENAZEPRIL  HCL 40 MG TABLET] 30 tablet 2    Sig: Take 1 tablet (40 mg total) by mouth daily. OFFICE VISIT NEEDED FOR ADDITIONAL REFILLS, CALL OFFICE TO SCHEDULE PHYSICAL     Cardiovascular:  ACE Inhibitors Failed - 10/01/2023  7:21 PM      Failed - Cr in normal range and within 180 days    Creatinine, Ser  Date Value Ref Range Status  12/25/2022 1.16 0.76 - 1.27 mg/dL Final         Failed - K in normal range and within 180 days    Potassium  Date Value Ref Range Status  12/25/2022 4.2 3.5 - 5.2 mmol/L Final         Failed - Valid encounter within last 6 months    Recent Outpatient Visits           9 months ago Routine general medical examination at a health care facility   [facility] [place], [place] P, DO   1 year ago Primary hypertension   [facility] [place], [place] P, DO   1 year ago Routine general medical examination at a health care facility   [facility] [place], [place] P, DO   2 years ago Primary hypertension   [facility] [place], [place], DO   2 years ago Routine general medical examination at a health care facility   [facility], [place], [place]              Passed - Patient is not pregnant      Passed - Last BP in normal range    BP Readings from Last 1 Encounters:  12/25/22 105/61

## 2023-10-06 ENCOUNTER — Other Ambulatory Visit: Payer: Self-pay | Admitting: Family Medicine

## 2023-10-06 DIAGNOSIS — R195 Other fecal abnormalities: Secondary | ICD-10-CM

## 2023-10-06 DIAGNOSIS — Z1211 Encounter for screening for malignant neoplasm of colon: Secondary | ICD-10-CM

## 2023-10-08 ENCOUNTER — Other Ambulatory Visit: Payer: Self-pay

## 2023-10-08 ENCOUNTER — Telehealth: Payer: Self-pay

## 2023-10-08 DIAGNOSIS — R195 Other fecal abnormalities: Secondary | ICD-10-CM

## 2023-10-08 DIAGNOSIS — Z1211 Encounter for screening for malignant neoplasm of colon: Secondary | ICD-10-CM

## 2023-10-08 MED ORDER — PEG 3350-KCL-NA BICARB-NACL 420 G PO SOLR: 4000.0000 mL | Freq: Once | ORAL | 0 refills | Status: AC

## 2023-10-08 NOTE — Telephone Encounter (Signed)
 Gastroenterology Pre-Procedure Review  Request Date: 10/28/18 Requesting Physician: Dr. Therisa  PATIENT REVIEW QUESTIONS: The patient responded to the following health history questions as indicated:    1. Are you having any GI issues? Stated he suspects he may have hemorrhoids which may have caused him to have a positive cologuard.  Last colonoscopy not in chart but stated it was about 20-25 years ago. 2. Do you have a personal history of Polyps? no 3. Do you have a family history of Colon Cancer or Polyps? no 4. Diabetes Mellitus? no 5. Joint replacements in the past 12 months?no 6. Major health problems in the past 3 months?no 7. Any artificial heart valves, MVP, or defibrillator?no    MEDICATIONS & ALLERGIES:    Patient reports the following regarding taking any anticoagulation/antiplatelet therapy:   Plavix, Coumadin, Eliquis, Xarelto, Lovenox, Pradaxa, Brilinta, or Effient? no Aspirin? no  Patient confirms/reports the following medications:  Current Outpatient Medications  Medication Sig Dispense Refill   benazepril  (LOTENSIN ) 40 MG tablet Take 1 tablet (40 mg total) by mouth daily. OFFICE VISIT NEEDED FOR ADDITIONAL REFILLS, CALL OFFICE TO SCHEDULE PHYSICAL 90 tablet 0   hydrochlorothiazide  (HYDRODIURIL ) 25 MG tablet TAKE 1 TABLET (25 MG TOTAL) BY MOUTH DAILY. 90 tablet 0   meloxicam  (MOBIC ) 15 MG tablet Take 1 tablet (15 mg total) by mouth daily. 90 tablet 3   methocarbamol  (ROBAXIN ) 500 MG tablet Take 1 tablet (500 mg total) by mouth 4 (four) times daily. 30 tablet 3   Misc Natural Products (GLUCOSAMINE CHOND COMPLEX/MSM PO) Take 2 each by mouth daily.     omeprazole  (PRILOSEC) 40 MG capsule TAKE 1 CAPSULE(40 MG) BY MOUTH DAILY 90 capsule 3   No current facility-administered medications for this visit.    Patient confirms/reports the following allergies:  Allergies  Allergen Reactions   Penicillins Rash    Reaction: Childhood    No orders of the defined types were  placed in this encounter.   AUTHORIZATION INFORMATION Primary Insurance: 1D#: Group #:  Secondary Insurance: 1D#: Group #:  SCHEDULE INFORMATION: Date: 10/28/22 Time: Location: [facility]

## 2023-10-18 ENCOUNTER — Telehealth: Payer: BC Managed Care – PPO | Admitting: Family Medicine

## 2023-10-18 DIAGNOSIS — J111 Influenza due to unidentified influenza virus with other respiratory manifestations: Secondary | ICD-10-CM | POA: Diagnosis not present

## 2023-10-18 MED ORDER — OSELTAMIVIR PHOSPHATE 75 MG PO CAPS: 75.0000 mg | ORAL_CAPSULE | Freq: Two times a day (BID) | ORAL | 0 refills | Status: AC

## 2023-10-18 NOTE — Progress Notes (Signed)

## 2023-10-23 ENCOUNTER — Telehealth: Payer: Self-pay

## 2023-10-23 ENCOUNTER — Encounter: Payer: Self-pay | Admitting: Family Medicine

## 2023-10-23 DIAGNOSIS — Z1211 Encounter for screening for malignant neoplasm of colon: Secondary | ICD-10-CM

## 2023-10-23 DIAGNOSIS — R195 Other fecal abnormalities: Secondary | ICD-10-CM

## 2023-10-23 NOTE — Telephone Encounter (Signed)
The patient called back to speak with Marcelino Duster.

## 2023-10-23 NOTE — Telephone Encounter (Signed)
The patient called in to cancel his procedure because he said it's to high.

## 2023-10-29 ENCOUNTER — Ambulatory Visit
Admission: RE | Admit: 2023-10-29 | Payer: BC Managed Care – PPO | Source: Home / Self Care | Admitting: Gastroenterology

## 2023-10-29 SURGERY — COLONOSCOPY WITH PROPOFOL
Anesthesia: General

## 2023-11-04 ENCOUNTER — Encounter: Payer: Self-pay | Admitting: Family Medicine

## 2023-11-27 DIAGNOSIS — K648 Other hemorrhoids: Secondary | ICD-10-CM | POA: Diagnosis not present

## 2023-11-27 DIAGNOSIS — K5520 Angiodysplasia of colon without hemorrhage: Secondary | ICD-10-CM | POA: Diagnosis not present

## 2023-11-27 DIAGNOSIS — Z1211 Encounter for screening for malignant neoplasm of colon: Secondary | ICD-10-CM | POA: Diagnosis not present

## 2023-11-27 LAB — HM COLONOSCOPY

## 2023-11-29 ENCOUNTER — Encounter: Payer: Self-pay | Admitting: Family Medicine

## 2023-12-25 ENCOUNTER — Other Ambulatory Visit: Payer: Self-pay | Admitting: Family Medicine

## 2023-12-26 NOTE — Telephone Encounter (Signed)
 Please review.  Sent to wrong office.  KP

## 2023-12-27 NOTE — Telephone Encounter (Signed)
Overdue for follow up. Needs appt.

## 2023-12-30 NOTE — Telephone Encounter (Signed)
 Left detailed message on machine for patient to call the office and schedule an appt.

## 2024-01-01 ENCOUNTER — Other Ambulatory Visit: Payer: Self-pay | Admitting: Family Medicine

## 2024-01-01 NOTE — Telephone Encounter (Signed)
 Requested medication (s) are due for refill today - yes  Requested medication (s) are on the active medication list -yes  Future visit scheduled -no  Last refill: 12/25/22 #90 3RF  Notes to clinic: fails lab protocol- over 1 year- 12/25/22  Requested Prescriptions  Pending Prescriptions Disp Refills   meloxicam (MOBIC) 15 MG tablet [Pharmacy Med Name: MELOXICAM 15 MG TABLET] 30 tablet 11    Sig: TAKE 1 TABLET (15 MG TOTAL) BY MOUTH DAILY.     Analgesics:  COX2 Inhibitors Failed - 01/01/2024  3:26 PM      Failed - Manual Review: Labs are only required if the patient has taken medication for more than 8 weeks.      Failed - HGB in normal range and within 360 days    Hemoglobin  Date Value Ref Range Status  12/25/2022 15.3 13.0 - 17.7 g/dL Final         Failed - Cr in normal range and within 360 days    Creatinine, Ser  Date Value Ref Range Status  12/25/2022 1.16 0.76 - 1.27 mg/dL Final         Failed - HCT in normal range and within 360 days    Hematocrit  Date Value Ref Range Status  12/25/2022 44.2 37.5 - 51.0 % Final         Failed - AST in normal range and within 360 days    AST  Date Value Ref Range Status  12/25/2022 22 0 - 40 IU/L Final         Failed - ALT in normal range and within 360 days    ALT  Date Value Ref Range Status  12/25/2022 23 0 - 44 IU/L Final         Failed - eGFR is 30 or above and within 360 days    GFR calc Af Amer  Date Value Ref Range Status  10/27/2020 85 >59 mL/min/1.73 Final    Comment:    **In accordance with recommendations from the NKF-ASN Task force,**   [facility] is in the process of updating its eGFR calculation to the   2021 CKD-EPI creatinine equation that estimates kidney function   without a race variable.    GFR calc non Af Amer  Date Value Ref Range Status  10/27/2020 73 >59 mL/min/1.73 Final   eGFR  Date Value Ref Range Status  12/25/2022 75 >59 mL/min/1.73 Final         Failed - Valid encounter within last 12  months    Recent Outpatient Visits   None            Passed - Patient is not pregnant         Requested Prescriptions  Pending Prescriptions Disp Refills   meloxicam (MOBIC) 15 MG tablet [Pharmacy Med Name: MELOXICAM 15 MG TABLET] 30 tablet 11    Sig: TAKE 1 TABLET (15 MG TOTAL) BY MOUTH DAILY.     Analgesics:  COX2 Inhibitors Failed - 01/01/2024  3:26 PM      Failed - Manual Review: Labs are only required if the patient has taken medication for more than 8 weeks.      Failed - HGB in normal range and within 360 days    Hemoglobin  Date Value Ref Range Status  12/25/2022 15.3 13.0 - 17.7 g/dL Final         Failed - Cr in normal range and within 360 days    Creatinine, Ser  Date  Value Ref Range Status  12/25/2022 1.16 0.76 - 1.27 mg/dL Final         Failed - HCT in normal range and within 360 days    Hematocrit  Date Value Ref Range Status  12/25/2022 44.2 37.5 - 51.0 % Final         Failed - AST in normal range and within 360 days    AST  Date Value Ref Range Status  12/25/2022 22 0 - 40 IU/L Final         Failed - ALT in normal range and within 360 days    ALT  Date Value Ref Range Status  12/25/2022 23 0 - 44 IU/L Final         Failed - eGFR is 30 or above and within 360 days    GFR calc Af Amer  Date Value Ref Range Status  10/27/2020 85 >59 mL/min/1.73 Final    Comment:    **In accordance with recommendations from the NKF-ASN Task force,**   [facility] is in the process of updating its eGFR calculation to the   2021 CKD-EPI creatinine equation that estimates kidney function   without a race variable.    GFR calc non Af Amer  Date Value Ref Range Status  10/27/2020 73 >59 mL/min/1.73 Final   eGFR  Date Value Ref Range Status  12/25/2022 75 >59 mL/min/1.73 Final         Failed - Valid encounter within last 12 months    Recent Outpatient Visits   None            Passed - Patient is not pregnant

## 2024-01-10 ENCOUNTER — Ambulatory Visit: Admitting: Family Medicine

## 2024-01-10 ENCOUNTER — Encounter: Payer: Self-pay | Admitting: Family Medicine

## 2024-01-10 VITALS — BP 111/69 | HR 50 | Ht 73.0 in | Wt 196.8 lb

## 2024-01-10 DIAGNOSIS — I10 Essential (primary) hypertension: Secondary | ICD-10-CM

## 2024-01-10 DIAGNOSIS — Z0000 Encounter for general adult medical examination without abnormal findings: Secondary | ICD-10-CM | POA: Diagnosis not present

## 2024-01-10 LAB — MICROALBUMIN, URINE WAIVED
Creatinine, Urine Waived: 300 mg/dL (ref 10–300)
Microalb, Ur Waived: 80 mg/L — ABNORMAL HIGH (ref 0–19)

## 2024-01-10 MED ORDER — OMEPRAZOLE 40 MG PO CPDR: DELAYED_RELEASE_CAPSULE | ORAL | 3 refills | Status: AC

## 2024-01-10 MED ORDER — MELOXICAM 15 MG PO TABS: 15.0000 mg | ORAL_TABLET | Freq: Every day | ORAL | 3 refills | Status: AC

## 2024-01-10 MED ORDER — HYDROCHLOROTHIAZIDE 25 MG PO TABS: 25.0000 mg | ORAL_TABLET | Freq: Every day | ORAL | 1 refills | Status: DC

## 2024-01-10 MED ORDER — BENAZEPRIL HCL 40 MG PO TABS: 40.0000 mg | ORAL_TABLET | Freq: Every day | ORAL | 1 refills | Status: DC

## 2024-01-10 NOTE — Progress Notes (Signed)
 BP 111/69 (BP Location: Left Arm, Patient Position: Sitting, Cuff Size: Large)   Pulse (!) 50   Ht 6' 1 (1.854 m)   Wt 196 lb 12.8 oz (89.3 kg)   SpO2 97%   BMI 25.96 kg/m    Subjective:    Patient ID: Brian Gillespie, male    DOB: Sep 04, 1971, 53 y.o.   MRN: [id number]  HPI: Brian Gillespie is a 53 y.o. male presenting on 01/10/2024 for comprehensive medical examination. Current medical complaints include:  HYPERTENSION  Hypertension status: controlled  Satisfied with current treatment? yes Duration of hypertension: chronic BP monitoring frequency:  not checking BP medication side effects:  no Medication compliance: excellent compliance Previous BP meds: benazepril , HCTZ Aspirin: no Recurrent headaches: no Visual changes: no Palpitations: no Dyspnea: no Chest pain: no Lower extremity edema: no Dizzy/lightheaded: no  He currently lives with: wife and son Interim Problems from his last visit: no  Depression Screen done today and results listed below:     01/10/2024    8:07 AM 12/25/2022    8:06 AM 06/25/2022    8:12 AM 12/20/2021   10:24 AM 12/02/2020    8:59 AM  Depression screen PHQ 2/9  Decreased Interest 0 0 0 0 0  Down, Depressed, Hopeless 2 0 0 0 0  PHQ - 2 Score 2 0 0 0 0  Altered sleeping 1 0 0 0   Tired, decreased energy 1 0 0 0   Change in appetite 0 0 0 0   Feeling bad or failure about yourself  0 0 0 0   Trouble concentrating 0 0 0 0   Moving slowly or fidgety/restless 0 0 0 0   Suicidal thoughts 0 0 0 0   PHQ-9 Score 4 0 0 0   Difficult doing work/chores Not difficult at all Not difficult at all Not difficult at all Not difficult at all     Past Medical History:  Past Medical History:  Diagnosis Date   Esophageal stenosis    Esophageal tear    Hypertension     Surgical History:  History reviewed. No pertinent surgical history.  Medications:  Current Outpatient Medications on File Prior to Visit  Medication Sig   methocarbamol  (ROBAXIN ) 500 MG  tablet Take 1 tablet (500 mg total) by mouth 4 (four) times daily.   Misc Natural Products (GLUCOSAMINE CHOND COMPLEX/MSM PO) Take 2 each by mouth daily.   No current facility-administered medications on file prior to visit.    Allergies:  Allergies  Allergen Reactions   Penicillins Rash    Reaction: Childhood    Social History:  Social History   Socioeconomic History   Marital status: Married    Spouse name: Not on file   Number of children: Not on file   Years of education: Not on file   Highest education level: Bachelor's degree (e.g., BA, AB, BS)  Occupational History   Not on file  Tobacco Use   Smoking status: Never   Smokeless tobacco: Former    Quit date: 09/24/2004  Vaping Use   Vaping status: Never Used  Substance and Sexual Activity   Alcohol use: No   Drug use: No   Sexual activity: Yes  Other Topics Concern   Not on file  Social History Narrative   Not on file   Social Drivers of Health   Financial Resource Strain: Low Risk  (12/24/2022)   Overall Financial Resource Strain (CARDIA)    Difficulty of Paying  Living Expenses: Not very hard  Food Insecurity: No Food Insecurity (12/24/2022)   Hunger Vital Sign    Worried About Running Out of Food in the Last Year: Never true    Ran Out of Food in the Last Year: Never true  Transportation Needs: No Transportation Needs (12/24/2022)   PRAPARE - Administrator, Civil Service (Medical): No    Lack of Transportation (Non-Medical): No  Physical Activity: Sufficiently Active (12/24/2022)   Exercise Vital Sign    Days of Exercise per Week: 5 days    Minutes of Exercise per Session: 90 min  Stress: No Stress Concern Present (12/24/2022)   [employer] of Occupational Health - Occupational Stress Questionnaire    Feeling of Stress : Only a little  Social Connections: Socially Integrated (12/24/2022)   Social Connection and Isolation Panel [NHANES]    Frequency of Communication with Friends and Family: Three  times a week    Frequency of Social Gatherings with Friends and Family: Twice a week    Attends Religious Services: More than 4 times per year    Active Member of [employer] or Organizations: Yes    Attends Engineer, Structural: More than 4 times per year    Marital Status: Married  Catering Manager Violence: Not on file   Social History   Tobacco Use  Smoking Status Never  Smokeless Tobacco Former   Quit date: 09/24/2004   Social History   Substance and Sexual Activity  Alcohol Use No    Family History:  Family History  Problem Relation Age of Onset   Cancer Paternal Grandmother    Cancer Paternal Grandfather     Past medical history, surgical history, medications, allergies, family history and social history reviewed with patient today and changes made to appropriate areas of the chart.   Review of Systems  Constitutional: Negative.   HENT: Negative.    Eyes: Negative.   Respiratory: Negative.    Cardiovascular: Negative.   Gastrointestinal: Negative.   Genitourinary: Negative.   Musculoskeletal: Negative.   Skin: Negative.   Neurological: Negative.   Endo/Heme/Allergies: Negative.   Psychiatric/Behavioral: Negative.     All other ROS negative except what is listed above and in the HPI.      Objective:    BP 111/69 (BP Location: Left Arm, Patient Position: Sitting, Cuff Size: Large)   Pulse (!) 50   Ht 6' 1 (1.854 m)   Wt 196 lb 12.8 oz (89.3 kg)   SpO2 97%   BMI 25.96 kg/m   Wt Readings from Last 3 Encounters:  01/10/24 196 lb 12.8 oz (89.3 kg)  12/25/22 199 lb 11.2 oz (90.6 kg)  06/25/22 198 lb 4.8 oz (89.9 kg)    Physical Exam Vitals and nursing note reviewed.  Constitutional:      General: He is not in acute distress.    Appearance: Normal appearance. He is not ill-appearing, toxic-appearing or diaphoretic.  HENT:     Head: Normocephalic and atraumatic.     Right Ear: Tympanic membrane, ear canal and external ear normal. There is no  impacted cerumen.     Left Ear: Tympanic membrane, ear canal and external ear normal. There is no impacted cerumen.     Nose: Nose normal. No congestion or rhinorrhea.     Mouth/Throat:     Mouth: Mucous membranes are moist.     Pharynx: Oropharynx is clear. No oropharyngeal exudate or posterior oropharyngeal erythema.  Eyes:     General:  No scleral icterus.       Right eye: No discharge.        Left eye: No discharge.     Extraocular Movements: Extraocular movements intact.     Conjunctiva/sclera: Conjunctivae normal.     Pupils: Pupils are equal, round, and reactive to light.  Neck:     Vascular: No carotid bruit.  Cardiovascular:     Rate and Rhythm: Normal rate and regular rhythm.     Pulses: Normal pulses.     Heart sounds: No murmur heard.    No friction rub. No gallop.  Pulmonary:     Effort: Pulmonary effort is normal. No respiratory distress.     Breath sounds: Normal breath sounds. No stridor. No wheezing, rhonchi or rales.  Chest:     Chest wall: No tenderness.  Abdominal:     General: Abdomen is flat. Bowel sounds are normal. There is no distension.     Palpations: Abdomen is soft. There is no mass.     Tenderness: There is no abdominal tenderness. There is no right CVA tenderness, left CVA tenderness, guarding or rebound.     Hernia: No hernia is present.  Genitourinary:    Comments: Genital exam deferred with shared decision making Musculoskeletal:        General: No swelling, tenderness, deformity or signs of injury.     Cervical back: Normal range of motion and neck supple. No rigidity. No muscular tenderness.     Right lower leg: No edema.     Left lower leg: No edema.  Lymphadenopathy:     Cervical: No cervical adenopathy.  Skin:    General: Skin is warm and dry.     Capillary Refill: Capillary refill takes less than 2 seconds.     Coloration: Skin is not jaundiced or pale.     Findings: No bruising, erythema, lesion or rash.  Neurological:     General:  No focal deficit present.     Mental Status: He is alert and oriented to person, place, and time.     Cranial Nerves: No cranial nerve deficit.     Sensory: No sensory deficit.     Motor: No weakness.     Coordination: Coordination normal.     Gait: Gait normal.     Deep Tendon Reflexes: Reflexes normal.  Psychiatric:        Mood and Affect: Mood normal.        Behavior: Behavior normal.        Thought Content: Thought content normal.        Judgment: Judgment normal.     Results for orders placed or performed in visit on 11/29/23  HM COLONOSCOPY   Collection Time: 11/27/23  7:41 AM  Result Value Ref Range   [facility] Colonoscopy See Report (in chart) See Report (in chart), Patient Reported      Assessment & Plan:   Problem List Items Addressed This Visit       Cardiovascular and Mediastinum   HTN (hypertension)   Under good control on current regimen. Continue current regimen. Continue to monitor. Call with any concerns. Refills given. Labs drawn today.        Relevant Medications   benazepril  (LOTENSIN ) 40 MG tablet   hydrochlorothiazide  (HYDRODIURIL ) 25 MG tablet   Other Relevant Orders   Microalbumin, Urine Waived   Other Visit Diagnoses       Routine general medical examination at a health care facility    -  Primary   Vaccines up to date/declined. Screening labs checked today. Colonoscopy up to date. Continue diet and exercise. Call with any concerns.   Relevant Orders   Comprehensive metabolic panel with GFR   CBC with Differential/Platelet   Lipid Panel w/o Chol/HDL Ratio   PSA   TSH       LABORATORY TESTING:  Health maintenance labs ordered today as discussed above.   The natural history of prostate cancer and ongoing controversy regarding screening and potential treatment outcomes of prostate cancer has been discussed with the patient. The meaning of a false positive PSA and a false negative PSA has been discussed. He indicates understanding of the  limitations of this screening test and wishes to proceed with screening PSA testing.   IMMUNIZATIONS:   - Tdap: Tetanus vaccination status reviewed: last tetanus booster within 10 years. - Influenza: Postponed to flu season - Pneumovax: Not applicable - Prevnar: Not applicable - COVID: Refused - HPV: Not applicable - Shingrix vaccine: Refused  SCREENING: - Colonoscopy: Up to date  Discussed with patient purpose of the colonoscopy is to detect colon cancer at curable precancerous or early stages   PATIENT COUNSELING:    Sexuality: Discussed sexually transmitted diseases, partner selection, use of condoms, avoidance of unintended pregnancy  and contraceptive alternatives.   Advised to avoid cigarette smoking.  I discussed with the patient that most people either abstain from alcohol or drink within safe limits (<=14/week and <=4 drinks/occasion for males, <=7/weeks and <= 3 drinks/occasion for females) and that the risk for alcohol disorders and other health effects rises proportionally with the number of drinks per week and how often a drinker exceeds daily limits.  Discussed cessation/primary prevention of drug use and availability of treatment for abuse.   Diet: Encouraged to adjust caloric intake to maintain  or achieve ideal body weight, to reduce intake of dietary saturated fat and total fat, to limit sodium intake by avoiding high sodium foods and not adding table salt, and to maintain adequate dietary potassium and calcium preferably from fresh fruits, vegetables, and low-fat dairy products.    stressed the importance of regular exercise  Injury prevention: Discussed safety belts, safety helmets, smoke detector, smoking near bedding or upholstery.   Dental health: Discussed importance of regular tooth brushing, flossing, and dental visits.   Follow up plan: NEXT PREVENTATIVE PHYSICAL DUE IN 1 YEAR. Return in about 6 months (around 07/11/2024).

## 2024-01-10 NOTE — Assessment & Plan Note (Signed)
 Under good control on current regimen. Continue current regimen. Continue to monitor. Call with any concerns. Refills given. Labs drawn today.

## 2024-01-11 LAB — CBC WITH DIFFERENTIAL/PLATELET
Basophils Absolute: 0 10*3/uL (ref 0.0–0.2)
Basos: 1 %
EOS (ABSOLUTE): 0.1 10*3/uL (ref 0.0–0.4)
Eos: 2 %
Hematocrit: 44.4 % (ref 37.5–51.0)
Hemoglobin: 14.9 g/dL (ref 13.0–17.7)
Immature Grans (Abs): 0 10*3/uL (ref 0.0–0.1)
Immature Granulocytes: 0 %
Lymphocytes Absolute: 2.1 10*3/uL (ref 0.7–3.1)
Lymphs: 35 %
MCH: 30 pg (ref 26.6–33.0)
MCHC: 33.6 g/dL (ref 31.5–35.7)
MCV: 90 fL (ref 79–97)
Monocytes Absolute: 0.6 10*3/uL (ref 0.1–0.9)
Monocytes: 9 %
Neutrophils Absolute: 3.1 10*3/uL (ref 1.4–7.0)
Neutrophils: 53 %
Platelets: 283 10*3/uL (ref 150–450)
RBC: 4.96 x10E6/uL (ref 4.14–5.80)
RDW: 12.9 % (ref 11.6–15.4)
WBC: 6 10*3/uL (ref 3.4–10.8)

## 2024-01-11 LAB — PSA: Prostate Specific Ag, Serum: 2.5 ng/mL (ref 0.0–4.0)

## 2024-01-11 LAB — COMPREHENSIVE METABOLIC PANEL WITH GFR
ALT: 23 IU/L (ref 0–44)
AST: 29 IU/L (ref 0–40)
Albumin: 4.5 g/dL (ref 3.8–4.9)
Alkaline Phosphatase: 62 IU/L (ref 44–121)
BUN/Creatinine Ratio: 17 (ref 9–20)
BUN: 25 mg/dL — ABNORMAL HIGH (ref 6–24)
Bilirubin Total: 0.3 mg/dL (ref 0.0–1.2)
CO2: 26 mmol/L (ref 20–29)
Calcium: 9.8 mg/dL (ref 8.7–10.2)
Chloride: 98 mmol/L (ref 96–106)
Creatinine, Ser: 1.44 mg/dL — ABNORMAL HIGH (ref 0.76–1.27)
Globulin, Total: 2.6 g/dL (ref 1.5–4.5)
Glucose: 87 mg/dL (ref 70–99)
Potassium: 3.9 mmol/L (ref 3.5–5.2)
Sodium: 138 mmol/L (ref 134–144)
Total Protein: 7.1 g/dL (ref 6.0–8.5)
eGFR: 58 mL/min/{1.73_m2} — ABNORMAL LOW (ref >59)

## 2024-01-11 LAB — LIPID PANEL W/O CHOL/HDL RATIO
Cholesterol, Total: 197 mg/dL (ref 100–199)
HDL: 46 mg/dL (ref >39)
LDL Chol Calc (NIH): 124 mg/dL — ABNORMAL HIGH (ref 0–99)
Triglycerides: 153 mg/dL — ABNORMAL HIGH (ref 0–149)
VLDL Cholesterol Cal: 27 mg/dL (ref 5–40)

## 2024-01-11 LAB — TSH: TSH: 1.63 u[IU]/mL (ref 0.450–4.500)

## 2024-01-13 ENCOUNTER — Encounter: Payer: Self-pay | Admitting: Family Medicine

## 2024-01-13 ENCOUNTER — Other Ambulatory Visit: Payer: Self-pay | Admitting: Family Medicine

## 2024-01-13 DIAGNOSIS — N289 Disorder of kidney and ureter, unspecified: Secondary | ICD-10-CM

## 2024-01-15 NOTE — Progress Notes (Signed)
 Called patient. Left message for patient to call office and schedule follow up labs in 3 weeks.

## 2024-01-25 ENCOUNTER — Encounter: Payer: Self-pay | Admitting: Family Medicine

## 2024-02-05 ENCOUNTER — Other Ambulatory Visit

## 2024-02-05 DIAGNOSIS — N289 Disorder of kidney and ureter, unspecified: Secondary | ICD-10-CM | POA: Diagnosis not present

## 2024-02-06 LAB — BASIC METABOLIC PANEL WITH GFR
BUN/Creatinine Ratio: 12 (ref 9–20)
BUN: 19 mg/dL (ref 6–24)
CO2: 24 mmol/L (ref 20–29)
Calcium: 9.8 mg/dL (ref 8.7–10.2)
Chloride: 101 mmol/L (ref 96–106)
Creatinine, Ser: 1.53 mg/dL — ABNORMAL HIGH (ref 0.76–1.27)
Glucose: 85 mg/dL (ref 70–99)
Potassium: 4.1 mmol/L (ref 3.5–5.2)
Sodium: 141 mmol/L (ref 134–144)
eGFR: 54 mL/min/{1.73_m2} — ABNORMAL LOW (ref >59)

## 2024-02-14 ENCOUNTER — Ambulatory Visit: Payer: Self-pay | Admitting: Family Medicine

## 2024-02-18 NOTE — Progress Notes (Signed)
 Called patient to schedule 1 month follow up. Left message for patient to call office and schedule.

## 2024-03-16 ENCOUNTER — Encounter: Payer: Self-pay | Admitting: Family Medicine

## 2024-04-03 ENCOUNTER — Ambulatory Visit: Admitting: Family Medicine

## 2024-04-08 ENCOUNTER — Encounter: Payer: Self-pay | Admitting: Family Medicine

## 2024-04-08 ENCOUNTER — Ambulatory Visit: Admitting: Family Medicine

## 2024-04-08 VITALS — BP 124/73 | HR 51 | Temp 98.9°F | Resp 15 | Ht 72.99 in | Wt 196.8 lb

## 2024-04-08 DIAGNOSIS — I10 Essential (primary) hypertension: Secondary | ICD-10-CM | POA: Diagnosis not present

## 2024-04-08 MED ORDER — BENAZEPRIL HCL 40 MG PO TABS: 40.0000 mg | ORAL_TABLET | Freq: Every day | ORAL | 0 refills | Status: DC

## 2024-04-08 NOTE — Progress Notes (Signed)
 BP 124/73 (BP Location: Left Arm, Patient Position: Sitting, Cuff Size: Large)   Pulse (!) 51   Temp 98.9 F (37.2 C) (Oral)   Resp 15   Ht 6' 0.99 (1.854 m)   Wt 196 lb 12.8 oz (89.3 kg)   SpO2 98%   BMI 25.97 kg/m    Subjective:    Patient ID: Brian Gillespie, male    DOB: 1971-02-24, 53 y.o.   MRN: [id number]  HPI: Brian Gillespie is a 53 y.o. male  Chief Complaint  Patient presents with   Hypertension    Concern for kidney function. Stopped hydrochlorothiazide  after he was told.    HYPERTENSION  Hypertension status: controlled  Satisfied with current treatment? yes Duration of hypertension: chronic BP monitoring frequency:  not checking BP medication side effects:  no Medication compliance: excellent compliance Previous BP meds: benazepril , hctz Aspirin: no Recurrent headaches: no Visual changes: no Palpitations: no Dyspnea: no Chest pain: no Lower extremity edema: no Dizzy/lightheaded: no  Relevant past medical, surgical, family and social history reviewed and updated as indicated. Interim medical history since our last visit reviewed. Allergies and medications reviewed and updated.  Review of Systems  Constitutional: Negative.   Respiratory: Negative.    Cardiovascular: Negative.   Musculoskeletal: Negative.   Neurological: Negative.   Psychiatric/Behavioral: Negative.      Per HPI unless specifically indicated above     Objective:    BP 124/73 (BP Location: Left Arm, Patient Position: Sitting, Cuff Size: Large)   Pulse (!) 51   Temp 98.9 F (37.2 C) (Oral)   Resp 15   Ht 6' 0.99 (1.854 m)   Wt 196 lb 12.8 oz (89.3 kg)   SpO2 98%   BMI 25.97 kg/m   Wt Readings from Last 3 Encounters:  04/08/24 196 lb 12.8 oz (89.3 kg)  01/10/24 196 lb 12.8 oz (89.3 kg)  12/25/22 199 lb 11.2 oz (90.6 kg)    Physical Exam Vitals and nursing note reviewed.  Constitutional:      General: He is not in acute distress.    Appearance: Normal appearance. He is  not ill-appearing, toxic-appearing or diaphoretic.  HENT:     Head: Normocephalic and atraumatic.     Right Ear: External ear normal.     Left Ear: External ear normal.     Nose: Nose normal.     Mouth/Throat:     Mouth: Mucous membranes are moist.     Pharynx: Oropharynx is clear.  Eyes:     General: No scleral icterus.       Right eye: No discharge.        Left eye: No discharge.     Extraocular Movements: Extraocular movements intact.     Conjunctiva/sclera: Conjunctivae normal.     Pupils: Pupils are equal, round, and reactive to light.  Cardiovascular:     Rate and Rhythm: Normal rate and regular rhythm.     Pulses: Normal pulses.     Heart sounds: Normal heart sounds. No murmur heard.    No friction rub. No gallop.  Pulmonary:     Effort: Pulmonary effort is normal. No respiratory distress.     Breath sounds: Normal breath sounds. No stridor. No wheezing, rhonchi or rales.  Chest:     Chest wall: No tenderness.  Musculoskeletal:        General: Normal range of motion.     Cervical back: Normal range of motion and neck supple.  Skin:  General: Skin is warm and dry.     Capillary Refill: Capillary refill takes less than 2 seconds.     Coloration: Skin is not jaundiced or pale.     Findings: No bruising, erythema, lesion or rash.  Neurological:     General: No focal deficit present.     Mental Status: He is alert and oriented to person, place, and time. Mental status is at baseline.  Psychiatric:        Mood and Affect: Mood normal.        Behavior: Behavior normal.        Thought Content: Thought content normal.        Judgment: Judgment normal.     Results for orders placed or performed in visit on 02/05/24  Basic metabolic panel with GFR   Collection Time: 02/05/24  8:22 AM  Result Value Ref Range   Glucose 85 70 - 99 mg/dL   BUN 19 6 - 24 mg/dL   Creatinine, Ser 8.46 (H) 0.76 - 1.27 mg/dL   eGFR 54 (L) >40 fO/fpw/8.26   BUN/Creatinine Ratio 12 9 - 20    Sodium 141 134 - 144 mmol/L   Potassium 4.1 3.5 - 5.2 mmol/L   Chloride 101 96 - 106 mmol/L   CO2 24 20 - 29 mmol/L   Calcium 9.8 8.7 - 10.2 mg/dL      Assessment & Plan:   Problem List Items Addressed This Visit       Cardiovascular and Mediastinum   HTN (hypertension) - Primary   Doing well off the hydrochlorothiazide . We will remain off of it. Rechecking labs today. Await results. Call with any concerns.       Relevant Medications   benazepril  (LOTENSIN ) 40 MG tablet   Other Relevant Orders   Basic metabolic panel with GFR     Follow up plan: Return in about 6 months (around 10/09/2024).

## 2024-04-08 NOTE — Assessment & Plan Note (Signed)
 Doing well off the hydrochlorothiazide . We will remain off of it. Rechecking labs today. Await results. Call with any concerns.

## 2024-04-09 LAB — BASIC METABOLIC PANEL WITH GFR
BUN/Creatinine Ratio: 20 (ref 9–20)
BUN: 18 mg/dL (ref 6–24)
CO2: 21 mmol/L (ref 20–29)
Calcium: 9.2 mg/dL (ref 8.7–10.2)
Chloride: 101 mmol/L (ref 96–106)
Creatinine, Ser: 0.89 mg/dL (ref 0.76–1.27)
Glucose: 74 mg/dL (ref 70–99)
Potassium: 3.8 mmol/L (ref 3.5–5.2)
Sodium: 139 mmol/L (ref 134–144)
eGFR: 102 mL/min/1.73 (ref >59)

## 2024-04-13 ENCOUNTER — Ambulatory Visit: Payer: Self-pay | Admitting: Family Medicine

## 2024-07-13 ENCOUNTER — Encounter: Payer: Self-pay | Admitting: Family Medicine

## 2024-07-13 ENCOUNTER — Ambulatory Visit: Admitting: Family Medicine

## 2024-07-13 VITALS — BP 134/78 | HR 48 | Temp 97.5°F | Ht 73.0 in | Wt 199.6 lb

## 2024-07-13 DIAGNOSIS — I10 Essential (primary) hypertension: Secondary | ICD-10-CM | POA: Diagnosis not present

## 2024-07-13 MED ORDER — BENAZEPRIL HCL 40 MG PO TABS: 40.0000 mg | ORAL_TABLET | Freq: Every day | ORAL | 1 refills | Status: AC

## 2024-07-13 NOTE — Progress Notes (Signed)
 BP 134/78   Pulse (!) 48   Temp (!) 97.5 F (36.4 C) (Oral)   Ht 6' 1 (1.854 m)   Wt 199 lb 9.6 oz (90.5 kg)   SpO2 96%   BMI 26.33 kg/m    Subjective:    Patient ID: Brian Gillespie, male    DOB: 24-May-1971, 53 y.o.   MRN: [id number]  HPI: Brian Gillespie is a 53 y.o. male  Chief Complaint  Patient presents with   Hypertension   HYPERTENSION  Hypertension status: controlled  Satisfied with current treatment? yes Duration of hypertension: chronic BP monitoring frequency:  not checking BP medication side effects:  no Medication compliance: excellent compliance Previous BP meds:benazepril , HCTZ Aspirin: no Recurrent headaches: no Visual changes: no Palpitations: no Dyspnea: no Chest pain: no Lower extremity edema: no Dizzy/lightheaded: no   Relevant past medical, surgical, family and social history reviewed and updated as indicated. Interim medical history since our last visit reviewed. Allergies and medications reviewed and updated.  Review of Systems  Constitutional: Negative.   Respiratory: Negative.    Cardiovascular: Negative.   Musculoskeletal: Negative.   Neurological: Negative.   Psychiatric/Behavioral: Negative.      Per HPI unless specifically indicated above     Objective:    BP 134/78   Pulse (!) 48   Temp (!) 97.5 F (36.4 C) (Oral)   Ht 6' 1 (1.854 m)   Wt 199 lb 9.6 oz (90.5 kg)   SpO2 96%   BMI 26.33 kg/m   Wt Readings from Last 3 Encounters:  07/13/24 199 lb 9.6 oz (90.5 kg)  04/08/24 196 lb 12.8 oz (89.3 kg)  01/10/24 196 lb 12.8 oz (89.3 kg)    Physical Exam Vitals and nursing note reviewed.  Constitutional:      General: He is not in acute distress.    Appearance: Normal appearance. He is not ill-appearing, toxic-appearing or diaphoretic.  HENT:     Head: Normocephalic and atraumatic.     Right Ear: External ear normal.     Left Ear: External ear normal.     Nose: Nose normal.     Mouth/Throat:     Mouth: Mucous  membranes are moist.     Pharynx: Oropharynx is clear.  Eyes:     General: No scleral icterus.       Right eye: No discharge.        Left eye: No discharge.     Extraocular Movements: Extraocular movements intact.     Conjunctiva/sclera: Conjunctivae normal.     Pupils: Pupils are equal, round, and reactive to light.  Cardiovascular:     Rate and Rhythm: Normal rate and regular rhythm.     Pulses: Normal pulses.     Heart sounds: Normal heart sounds. No murmur heard.    No friction rub. No gallop.  Pulmonary:     Effort: Pulmonary effort is normal. No respiratory distress.     Breath sounds: Normal breath sounds. No stridor. No wheezing, rhonchi or rales.  Chest:     Chest wall: No tenderness.  Musculoskeletal:        General: Normal range of motion.     Cervical back: Normal range of motion and neck supple.  Skin:    General: Skin is warm and dry.     Capillary Refill: Capillary refill takes less than 2 seconds.     Coloration: Skin is not jaundiced or pale.     Findings: No bruising, erythema, lesion  or rash.  Neurological:     General: No focal deficit present.     Mental Status: He is alert and oriented to person, place, and time. Mental status is at baseline.  Psychiatric:        Mood and Affect: Mood normal.        Behavior: Behavior normal.        Thought Content: Thought content normal.        Judgment: Judgment normal.     Results for orders placed or performed in visit on 04/08/24  Basic metabolic panel with GFR   Collection Time: 04/08/24  1:39 PM  Result Value Ref Range   Glucose 74 70 - 99 mg/dL   BUN 18 6 - 24 mg/dL   Creatinine, Ser 9.10 0.76 - 1.27 mg/dL   eGFR 897 >40 fO/fpw/8.26   BUN/Creatinine Ratio 20 9 - 20   Sodium 139 134 - 144 mmol/L   Potassium 3.8 3.5 - 5.2 mmol/L   Chloride 101 96 - 106 mmol/L   CO2 21 20 - 29 mmol/L   Calcium 9.2 8.7 - 10.2 mg/dL      Assessment & Plan:   Problem List Items Addressed This Visit        Cardiovascular and Mediastinum   HTN (hypertension) - Primary   Under good control on current regimen. Continue current regimen. Continue to monitor. Call with any concerns. Refills given. Labs drawn today.        Relevant Medications   benazepril  (LOTENSIN ) 40 MG tablet   Other Relevant Orders   Basic metabolic panel with GFR     Follow up plan: Return in about 6 months (around 01/11/2025) for physical.

## 2024-07-13 NOTE — Assessment & Plan Note (Signed)
 Under good control on current regimen. Continue current regimen. Continue to monitor. Call with any concerns. Refills given. Labs drawn today.

## 2024-07-14 ENCOUNTER — Ambulatory Visit: Payer: Self-pay | Admitting: Family Medicine

## 2024-07-14 LAB — BASIC METABOLIC PANEL WITH GFR
BUN/Creatinine Ratio: 16 (ref 9–20)
BUN: 18 mg/dL (ref 6–24)
CO2: 25 mmol/L (ref 20–29)
Calcium: 9.9 mg/dL (ref 8.7–10.2)
Chloride: 105 mmol/L (ref 96–106)
Creatinine, Ser: 1.11 mg/dL (ref 0.76–1.27)
Glucose: 68 mg/dL — ABNORMAL LOW (ref 70–99)
Potassium: 4.3 mmol/L (ref 3.5–5.2)
Sodium: 143 mmol/L (ref 134–144)
eGFR: 79 mL/min/1.73 (ref >59)

## 2024-10-14 ENCOUNTER — Ambulatory Visit: Admitting: Family Medicine

## 2025-01-13 ENCOUNTER — Encounter: Admitting: Family Medicine
